# Patient Record
Sex: Male | Born: 1955 | Race: White | Hispanic: No | Marital: Married | Smoking: Current some day smoker
Health system: Southern US, Community
[De-identification: ages and names within clinical notes are randomized; demographics above are authoritative.]

## PROBLEM LIST (undated history)

## (undated) DIAGNOSIS — G4730 Sleep apnea, unspecified: Secondary | ICD-10-CM

## (undated) DIAGNOSIS — K219 Gastro-esophageal reflux disease without esophagitis: Secondary | ICD-10-CM

## (undated) DIAGNOSIS — D649 Anemia, unspecified: Secondary | ICD-10-CM

## (undated) DIAGNOSIS — D563 Thalassemia minor: Secondary | ICD-10-CM

## (undated) DIAGNOSIS — S92309A Fracture of unspecified metatarsal bone(s), unspecified foot, initial encounter for closed fracture: Secondary | ICD-10-CM

## (undated) DIAGNOSIS — I10 Essential (primary) hypertension: Secondary | ICD-10-CM

## (undated) DIAGNOSIS — F329 Major depressive disorder, single episode, unspecified: Secondary | ICD-10-CM

## (undated) DIAGNOSIS — F32A Depression, unspecified: Secondary | ICD-10-CM

## (undated) DIAGNOSIS — E785 Hyperlipidemia, unspecified: Secondary | ICD-10-CM

## (undated) DIAGNOSIS — N400 Enlarged prostate without lower urinary tract symptoms: Secondary | ICD-10-CM

## (undated) DIAGNOSIS — F419 Anxiety disorder, unspecified: Secondary | ICD-10-CM

## (undated) HISTORY — PX: FOOT ARTHROTOMY: SHX952

## (undated) HISTORY — DX: Essential (primary) hypertension: I10

## (undated) HISTORY — PX: OTHER SURGICAL HISTORY: SHX169

## (undated) HISTORY — PX: UPPER GI ENDOSCOPY: SHX6162

## (undated) HISTORY — DX: Benign prostatic hyperplasia without lower urinary tract symptoms: N40.0

## (undated) HISTORY — DX: Gastro-esophageal reflux disease without esophagitis: K21.9

## (undated) HISTORY — DX: Fracture of unspecified metatarsal bone(s), unspecified foot, initial encounter for closed fracture: S92.309A

## (undated) HISTORY — DX: Hyperlipidemia, unspecified: E78.5

## (undated) HISTORY — PX: COLONOSCOPY: SHX174

---
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
## 2006-03-24 ENCOUNTER — Ambulatory Visit: Payer: Self-pay | Admitting: Gastroenterology

## 2006-04-07 ENCOUNTER — Ambulatory Visit: Payer: Self-pay | Admitting: Gastroenterology

## 2007-08-14 ENCOUNTER — Ambulatory Visit (HOSPITAL_COMMUNITY): Admission: RE | Admit: 2007-08-14 | Discharge: 2007-08-14 | Payer: Self-pay | Admitting: Orthopedic Surgery

## 2008-09-05 HISTORY — PX: FINGER EXPLORATION: SHX1635

## 2009-03-11 ENCOUNTER — Encounter (INDEPENDENT_AMBULATORY_CARE_PROVIDER_SITE_OTHER): Payer: Self-pay | Admitting: Orthopedic Surgery

## 2009-03-11 ENCOUNTER — Ambulatory Visit (HOSPITAL_BASED_OUTPATIENT_CLINIC_OR_DEPARTMENT_OTHER): Admission: RE | Admit: 2009-03-11 | Discharge: 2009-03-11 | Payer: Self-pay | Admitting: Orthopedic Surgery

## 2010-09-05 DIAGNOSIS — G4730 Sleep apnea, unspecified: Secondary | ICD-10-CM

## 2010-09-05 HISTORY — DX: Sleep apnea, unspecified: G47.30

## 2010-12-12 LAB — POCT HEMOGLOBIN-HEMACUE: Hemoglobin: 12.6 g/dL — ABNORMAL LOW (ref 13.0–17.0)

## 2011-01-18 NOTE — Op Note (Signed)
NAME:  [name], [name] NO.:  [id number]   MEDICAL RECORD NO.:  [id number]          PATIENT TYPE:  AMB   LOCATION:  DSC                          FACILITY:  [facility]   PHYSICIAN:  Cindee Salt, M.D.       DATE OF BIRTH:  08/19/1956   DATE OF PROCEDURE:  03/11/2009  DATE OF DISCHARGE:                               OPERATIVE REPORT   PREOPERATIVE DIAGNOSIS:  Mass, left ring finger.   POSTOPERATIVE DIAGNOSIS:  Mass, left ring finger.   OPERATION:  Excisional biopsy of mass, left ring finger.   SURGEON:  Cindee Salt, MD   ASSISTANT:  Carolyne Fiscal, RN   ANESTHESIA:  Forearm-based IV regional.   ANESTHESIOLOGIST:  Bedelia Person, MD   HISTORY:  The patient is a 55 year old male with a history of mass of  the metacarpophalangeal joint volar aspect of his left ring finger.  He  has elected to have this removed.  He is aware of risks and  complications including infection, recurrence of injury to arteries,  nerves, tendons, incomplete relief of symptoms, dystrophy.  In the  preoperative area, the patient is seen, the extremity marked by both the  patient and surgeon, antibiotic given.   PROCEDURE:  The patient was brought to the operating room where a  forearm-based IV regional anesthetic was carried out without difficulty.  Antibiotic had been given in the preoperative area.  He was prepped  using ChloraPrep, supine position, left arm free.  A time-out was taken  along with a 3-minute dry time.  He was draped.  The patient's  procedure were confirmed.  An oblique incision was made after adequate  anesthesia was afforded with the IV regional anesthetic.  The incision  was directly over the mass, carried down through subcutaneous tissue.  The mass was immediately encountered.  This was a whitish multilobulated  mass and appeared solid.  With blunt and sharp dissection, it was  dissected free, found to arise from the flexor tendon.  The  neurovascular bundles on radial and ulnar side  were identified and  protected throughout the procedure.  The mass was excised.  A smaller  similar mass was then found, this was excised and sent to Pathology  along with the first.  Wound was copiously irrigated with saline.  The  skin was then closed with interrupted 4-0 Vicryl Rapide sutures.  Local  infiltration was given with  0.25% Marcaine without epinephrine.  A sterile compressive dressing was  applied.  On deflation of the tourniquet, all fingers immediately  pinked.  He was taken to the recovery room for observation in  satisfactory condition.  He will be discharged home to return to the  [facility] in 1 week on Vicodin.           ______________________________  Cindee Salt, M.D.     GK/MEDQ  D:  03/11/2009  T:  03/12/2009  Job:  [id number]   cc:   Salvatore Decent

## 2011-04-20 ENCOUNTER — Encounter: Payer: Self-pay | Admitting: Gastroenterology

## 2011-05-17 ENCOUNTER — Encounter: Payer: Self-pay | Admitting: Gastroenterology

## 2011-06-21 ENCOUNTER — Encounter: Payer: Self-pay | Admitting: Gastroenterology

## 2011-06-21 ENCOUNTER — Ambulatory Visit (INDEPENDENT_AMBULATORY_CARE_PROVIDER_SITE_OTHER): Payer: BC Managed Care – PPO | Admitting: Gastroenterology

## 2011-06-21 VITALS — BP 124/72 | HR 88 | Ht 70.0 in | Wt 165.0 lb

## 2011-06-21 DIAGNOSIS — K219 Gastro-esophageal reflux disease without esophagitis: Secondary | ICD-10-CM

## 2011-06-21 DIAGNOSIS — Z800 Family history of malignant neoplasm of digestive organs: Secondary | ICD-10-CM

## 2011-06-21 MED ORDER — PEG-KCL-NACL-NASULF-NA ASC-C 100 G PO SOLR: 1.0000 | Freq: Once | ORAL | Status: DC

## 2011-06-21 NOTE — Patient Instructions (Signed)
Take your omeprazole one tablet by mouth twice daily.  You have been scheduled for a Upper Endoscopy/ Colonoscopy. See separate instructions.  Pick up your prep kit from your pharmacy.  Patient advised to avoid spicy, acidic, citrus, chocolate, mints, fruit and fruit juices.  Limit the intake of caffeine, alcohol and Soda.  Don't exercise too soon after eating.  Don't lie down within 3-4 hours of eating.  Elevate the head of your bed. cc: Salvatore Decent, MD

## 2011-06-21 NOTE — Progress Notes (Signed)
History of Present Illness: This is a 55 year old male with frequent reflux symptoms over the past 3 years. He was initially treated with Prilosec OTC and when his symptoms worsened he was placed on omeprazole 40 mg daily and then recommended increase to twice daily which he has not done. He's been evaluated by an ENT physician and told of GERD and LPR, advised to modify caffeine intake and take omeprazole twice daily. He previously had postprandial and nocturnal heartburn but over the past year his symptoms have mainly been throat clearing. Denies weight loss, abdominal pain, constipation, diarrhea, change in stool caliber, melena, hematochezia, nausea, vomiting, dysphagia, chest pain.  Review of Systems: Pertinent positive and negative review of systems were noted in the above HPI section. All other review of systems were otherwise negative.  Current Medications, Allergies, Past Medical History, Past Surgical History, Family History and Social History were reviewed in [employer] record.  Physical Exam: General: Well developed , well nourished, no acute distress Head: Normocephalic and atraumatic Eyes:  sclerae anicteric, EOMI Ears: Normal auditory acuity Mouth: No deformity or lesions Neck: Supple, no masses or thyromegaly Lungs: Clear throughout to auscultation Heart: Regular rate and rhythm; no murmurs, rubs or bruits Abdomen: Soft, non tender and non distended. No masses, hepatosplenomegaly or hernias noted. Normal Bowel sounds Rectal: Deferred to colonoscopy  Musculoskeletal: Symmetrical with no gross deformities  Skin: No lesions on visible extremities Pulses:  Normal pulses noted Extremities: No clubbing, cyanosis, edema or deformities noted Neurological: Alert oriented x 4, grossly nonfocal Cervical Nodes:  No significant cervical adenopathy Inguinal Nodes: No significant inguinal adenopathy Psychological:  Alert and cooperative. Normal mood and  affect  Assessment and Recommendations:  1. 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 Standard antireflux measures and elevate the head of his bed. Omeprazole 40 mg twice daily is recommended. Schedule endoscopy to evaluate for Barrett's and erosive esophagitis. The risks, benefits, and alternatives to endoscopy with possible biopsy and possible dilation were discussed with the patient and they consent to proceed.   2. Family history of colon cancer in his mother. He is due for 5 years screening colonoscopy. The risks, benefits, and alternatives to colonoscopy with possible biopsy and possible polypectomy were discussed with the patient and they consent to proceed.

## 2011-06-22 ENCOUNTER — Encounter: Payer: Self-pay | Admitting: Gastroenterology

## 2011-06-27 ENCOUNTER — Encounter: Payer: Self-pay | Admitting: Gastroenterology

## 2011-06-27 ENCOUNTER — Ambulatory Visit (AMBULATORY_SURGERY_CENTER): Payer: BC Managed Care – PPO | Admitting: Gastroenterology

## 2011-06-27 DIAGNOSIS — K2990 Gastroduodenitis, unspecified, without bleeding: Secondary | ICD-10-CM

## 2011-06-27 DIAGNOSIS — Z1211 Encounter for screening for malignant neoplasm of colon: Secondary | ICD-10-CM

## 2011-06-27 DIAGNOSIS — D126 Benign neoplasm of colon, unspecified: Secondary | ICD-10-CM

## 2011-06-27 DIAGNOSIS — K2970 Gastritis, unspecified, without bleeding: Secondary | ICD-10-CM

## 2011-06-27 DIAGNOSIS — Z800 Family history of malignant neoplasm of digestive organs: Secondary | ICD-10-CM

## 2011-06-27 DIAGNOSIS — K219 Gastro-esophageal reflux disease without esophagitis: Secondary | ICD-10-CM

## 2011-06-27 DIAGNOSIS — K2940 Chronic atrophic gastritis without bleeding: Secondary | ICD-10-CM

## 2011-06-27 MED ORDER — SODIUM CHLORIDE 0.9 % IV SOLN: 500.0000 mL | INTRAVENOUS | Status: DC

## 2011-06-27 NOTE — Patient Instructions (Signed)
Green and blue discharge instructions reviewed with patient and care partner.  Impressions/recommendations:  Mild gastritis (handout given)  Polyp (handout given)  Repeat colonoscopy in 5 years.  Resume medications as you were taking them prior to your procedure.

## 2011-06-28 ENCOUNTER — Telehealth: Payer: Self-pay | Admitting: *Deleted

## 2011-06-28 NOTE — Telephone Encounter (Signed)

## 2011-06-30 ENCOUNTER — Encounter: Payer: Self-pay | Admitting: Gastroenterology

## 2012-07-12 ENCOUNTER — Other Ambulatory Visit: Payer: Self-pay | Admitting: Orthopedic Surgery

## 2012-07-13 ENCOUNTER — Encounter (HOSPITAL_BASED_OUTPATIENT_CLINIC_OR_DEPARTMENT_OTHER): Payer: Self-pay | Admitting: *Deleted

## 2012-07-13 NOTE — Progress Notes (Signed)
Pt has been her for hand surg 2010-now uses cpap-will bring cpap-overnight bag and meds in case her needs to stay-no cardiac or resp problems

## 2012-07-16 NOTE — H&P (Signed)
  Benjamin Lloyd is an 57 y.o. male.   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  He is 5'10" tall and weighs 160 pounds.  He has been having increasing right shoulder pain for the past six months.  He has stiffness.  He cannot place his arm in a sleeve comfortably.  He has trouble sleeping on the right and usually sleeps on his left side in a lateral decubitus position.  He has no numbness or tingling and no neck pain.  He has no history of diabetes or family history of diabetes.  He has not used any prescription pain medication for this predicament.      Past Medical History  Diagnosis Date  . GERD (gastroesophageal reflux disease)   . Depression   . Sleep apnea 2012    mild-mod-does use a cpap-will bring dos  . 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 Appendectomy 1970  . Colonoscopy   . Upper gi endoscopy   . Finger exploration 2010    mass lt finger  . Foot arthrotomy     left    Family History  Problem Relation Age of Onset  . Colon cancer Mother 83  . Heart disease Father    Social History:  reports that he quit smoking about 31 years ago. He has never used smokeless tobacco. He reports that he drinks alcohol. He reports that he does not use illicit drugs.  Allergies: No Known Allergies  No prescriptions prior to admission    No results found for this or any previous visit (from the past 48 hour(s)).  No results found.   Pertinent items are noted in HPI.  Height 5\' 10"  (1.778 m), weight 74.844 kg (165 lb).  General appearance: alert Head: Normocephalic, without obvious abnormality Neck: supple, symmetrical, trachea midline Resp: clear to auscultation bilaterally Cardio: regular rate and rhythm GI: normal findings: bowel sounds normal Extremities:  His shoulder range of motion reveals combined elevation 155  right, 180 left, external rotation 80 right and 95 left, internal rotation L-3 right and T-8 left.  He lacks 20 degrees of extension to the scapular plane right vs. hyperextension 10 degrees left.   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  His strength is well preserved.    RADIOGRAPHS:  Plain films of the shoulder demonstrate normal bony anatomy.  There is no calcification of the rotator cuff.     Pulses: 2+ and symmetric Skin: normal Neurologic: Grossly normal   Assessment/Plan Impression:Right shoulder impingement with A/C arthrosis   Plan: To the OR for right SA with SAD/DCR and debridement as needed.The procedure, risks,benefits and post-op course were discussed with the patient at length and they were in agreement with the plan.   Benjamin Lloyd J 07/16/2012, 4:16 PM

## 2012-07-17 ENCOUNTER — Encounter (HOSPITAL_BASED_OUTPATIENT_CLINIC_OR_DEPARTMENT_OTHER): Payer: Self-pay

## 2012-07-17 ENCOUNTER — Encounter (HOSPITAL_BASED_OUTPATIENT_CLINIC_OR_DEPARTMENT_OTHER)
Admission: RE | Disposition: A | Discharge status: Home / Self Care | Payer: Self-pay | Source: Ambulatory Visit | Attending: Orthopedic Surgery

## 2012-07-17 ENCOUNTER — Ambulatory Visit (HOSPITAL_BASED_OUTPATIENT_CLINIC_OR_DEPARTMENT_OTHER)
Admission: RE | Admit: 2012-07-17 | Discharge: 2012-07-18 | Disposition: A | Discharge status: Home / Self Care | Payer: BC Managed Care – PPO | Source: Ambulatory Visit | Attending: Orthopedic Surgery | Admitting: Orthopedic Surgery

## 2012-07-17 DIAGNOSIS — K219 Gastro-esophageal reflux disease without esophagitis: Secondary | ICD-10-CM | POA: Insufficient documentation

## 2012-07-17 DIAGNOSIS — M19019 Primary osteoarthritis, unspecified shoulder: Secondary | ICD-10-CM | POA: Insufficient documentation

## 2012-07-17 DIAGNOSIS — M7500 Adhesive capsulitis of unspecified shoulder: Secondary | ICD-10-CM | POA: Insufficient documentation

## 2012-07-17 DIAGNOSIS — M24119 Other articular cartilage disorders, unspecified shoulder: Secondary | ICD-10-CM | POA: Insufficient documentation

## 2012-07-17 HISTORY — DX: Anemia, unspecified: D64.9

## 2012-07-17 HISTORY — DX: Sleep apnea, unspecified: G47.30

## 2012-07-17 HISTORY — PX: SHOULDER ARTHROSCOPY WITH SUBACROMIAL DECOMPRESSION: SHX5684

## 2012-07-17 HISTORY — PX: SHOULDER ARTHROSCOPY WITH DISTAL CLAVICLE RESECTION: SHX5675

## 2012-07-17 HISTORY — DX: Depression, unspecified: F32.A

## 2012-07-17 HISTORY — DX: Major depressive disorder, single episode, unspecified: F32.9

## 2012-07-17 SURGERY — SHOULDER ARTHROSCOPY WITH DISTAL CLAVICLE RESECTION
Anesthesia: General | Site: Shoulder | Laterality: Right | Wound class: Clean

## 2012-07-17 SURGICAL SUPPLY — 80 items
ANCHOR PUSHLOCK PEEK 3.5X19.5 (Anchor) ×2 IMPLANT
BANDAGE ADHESIVE 1X3 (GAUZE/BANDAGES/DRESSINGS) IMPLANT
BLADE AVERAGE 25X9 (BLADE) IMPLANT
BLADE CUTTER MENIS 5.5 (BLADE) ×2 IMPLANT
BLADE SURG 15 STRL LF DISP TIS (BLADE) ×2 IMPLANT
BLADE SURG 15 STRL SS (BLADE) ×2
BUR EGG 3PK/BX (BURR) IMPLANT
BUR OVAL 6.0 (BURR) ×2 IMPLANT
CANISTER OMNI JUG 16 LITER (MISCELLANEOUS) ×4 IMPLANT
CANISTER SUCTION 2500CC (MISCELLANEOUS) IMPLANT
CANNULA SHOULDER 7CM (CANNULA) IMPLANT
CANNULA TWIST IN 8.25X7CM (CANNULA) ×2 IMPLANT
CLEANER CAUTERY TIP 5X5 PAD (MISCELLANEOUS) IMPLANT
CLOTH BEACON ORANGE TIMEOUT ST (SAFETY) ×2 IMPLANT
CUTTER MENISCUS  4.2MM (BLADE) ×1
CUTTER MENISCUS 4.2MM (BLADE) ×1 IMPLANT
DECANTER SPIKE VIAL GLASS SM (MISCELLANEOUS) IMPLANT
DRAPE INCISE IOBAN 66X45 STRL (DRAPES) ×2 IMPLANT
DRAPE STERI 35X30 U-POUCH (DRAPES) ×2 IMPLANT
DRAPE SURG 17X23 STRL (DRAPES) ×2 IMPLANT
DRAPE U-SHAPE 47X51 STRL (DRAPES) ×2 IMPLANT
DRAPE U-SHAPE 76X120 STRL (DRAPES) ×4 IMPLANT
DRSG PAD ABDOMINAL 8X10 ST (GAUZE/BANDAGES/DRESSINGS) ×2 IMPLANT
DURAPREP 26ML APPLICATOR (WOUND CARE) ×2 IMPLANT
ELECT REM PT RETURN 9FT ADLT (ELECTROSURGICAL) ×2
ELECTRODE REM PT RTRN 9FT ADLT (ELECTROSURGICAL) ×1 IMPLANT
GLOVE BIO SURGEON STRL SZ 6.5 (GLOVE) ×2 IMPLANT
GLOVE BIOGEL M STRL SZ7.5 (GLOVE) ×2 IMPLANT
GLOVE BIOGEL PI IND STRL 7.0 (GLOVE) ×1 IMPLANT
GLOVE BIOGEL PI IND STRL 8 (GLOVE) ×2 IMPLANT
GLOVE BIOGEL PI INDICATOR 7.0 (GLOVE) ×1
GLOVE BIOGEL PI INDICATOR 8 (GLOVE) ×2
GLOVE ORTHO TXT STRL SZ7.5 (GLOVE) ×2 IMPLANT
GOWN BRE IMP PREV XXLGXLNG (GOWN DISPOSABLE) ×4 IMPLANT
GOWN PREVENTION PLUS XLARGE (GOWN DISPOSABLE) ×2 IMPLANT
LASSO SUT 90 DEGREE (SUTURE) ×2 IMPLANT
LOOP 2 FIBERLINK CLOSED (SUTURE) ×2 IMPLANT
NDL SUT 6 .5 CRC .975X.05 MAYO (NEEDLE) IMPLANT
NEEDLE MAYO TAPER (NEEDLE)
NEEDLE MINI RC 24MM (NEEDLE) IMPLANT
NEEDLE SCORPION (NEEDLE) ×2 IMPLANT
PACK ARTHROSCOPY DSU (CUSTOM PROCEDURE TRAY) ×2 IMPLANT
PACK BASIN DAY SURGERY FS (CUSTOM PROCEDURE TRAY) ×2 IMPLANT
PAD CLEANER CAUTERY TIP 5X5 (MISCELLANEOUS)
PASSER SUT SWANSON 36MM LOOP (INSTRUMENTS) IMPLANT
PENCIL BUTTON HOLSTER BLD 10FT (ELECTRODE) IMPLANT
SLEEVE SCD COMPRESS KNEE MED (MISCELLANEOUS) ×2 IMPLANT
SLING ARM FOAM STRAP LRG (SOFTGOODS) ×2 IMPLANT
SLING ARM FOAM STRAP MED (SOFTGOODS) IMPLANT
SPONGE GAUZE 4X4 12PLY (GAUZE/BANDAGES/DRESSINGS) ×2 IMPLANT
SPONGE LAP 4X18 X RAY DECT (DISPOSABLE) ×2 IMPLANT
STRIP CLOSURE SKIN 1/2X4 (GAUZE/BANDAGES/DRESSINGS) ×2 IMPLANT
SUCTION FRAZIER TIP 10 FR DISP (SUCTIONS) IMPLANT
SUT ETHIBOND 2 OS 4 DA (SUTURE) IMPLANT
SUT ETHILON 4 0 PS 2 18 (SUTURE) IMPLANT
SUT FIBERWIRE #2 38 T-5 BLUE (SUTURE)
SUT FIBERWIRE 3-0 18 TAPR NDL (SUTURE)
SUT PROLENE 1 CT (SUTURE) IMPLANT
SUT PROLENE 3 0 PS 2 (SUTURE) ×2 IMPLANT
SUT TIGER TAPE 7 IN WHITE (SUTURE) IMPLANT
SUT VIC AB 0 CT1 27 (SUTURE)
SUT VIC AB 0 CT1 27XBRD ANBCTR (SUTURE) IMPLANT
SUT VIC AB 0 SH 27 (SUTURE) IMPLANT
SUT VIC AB 2-0 SH 27 (SUTURE)
SUT VIC AB 2-0 SH 27XBRD (SUTURE) IMPLANT
SUT VIC AB 3-0 SH 27 (SUTURE) ×1
SUT VIC AB 3-0 SH 27X BRD (SUTURE) ×1 IMPLANT
SUT VIC AB 3-0 X1 27 (SUTURE) IMPLANT
SUTURE FIBERWR #2 38 T-5 BLUE (SUTURE) IMPLANT
SUTURE FIBERWR 3-0 18 TAPR NDL (SUTURE) IMPLANT
SYR 3ML 23GX1 SAFETY (SYRINGE) IMPLANT
SYR BULB 3OZ (MISCELLANEOUS) IMPLANT
TAPE FIBER 2MM 7IN #2 BLUE (SUTURE) IMPLANT
TAPE PAPER 3X10 WHT MICROPORE (GAUZE/BANDAGES/DRESSINGS) ×2 IMPLANT
TOWEL OR 17X24 6PK STRL BLUE (TOWEL DISPOSABLE) ×2 IMPLANT
TUBE CONNECTING 20X1/4 (TUBING) ×2 IMPLANT
TUBING ARTHROSCOPY IRRIG 16FT (MISCELLANEOUS) ×2 IMPLANT
WAND STAR VAC 90 (SURGICAL WAND) ×2 IMPLANT
WATER STERILE IRR 1000ML POUR (IV SOLUTION) ×2 IMPLANT
YANKAUER SUCT BULB TIP NO VENT (SUCTIONS) IMPLANT

## 2012-07-18 MED ORDER — LIDOCAINE HCL 2 % IJ SOLN
INTRAMUSCULAR | Status: DC | PRN
  Administered 2012-07-17: 3 mL

## 2012-07-18 MED ORDER — SODIUM CHLORIDE 0.9 % IR SOLN
Status: DC | PRN
  Administered 2012-07-17: 18000 mL

## 2012-07-18 MED ORDER — METHYLPREDNISOLONE ACETATE 40 MG/ML IJ SUSP
INTRAMUSCULAR | Status: DC | PRN
  Administered 2012-07-17: 80 mg via INTRA_ARTICULAR

## 2012-07-19 ENCOUNTER — Encounter (HOSPITAL_BASED_OUTPATIENT_CLINIC_OR_DEPARTMENT_OTHER): Payer: Self-pay | Admitting: Orthopedic Surgery

## 2012-07-19 NOTE — H&P (Signed)
History and physical reviewed and signed.  EPIC unavailable due to fiberoptic cable damage.

## 2012-07-20 NOTE — Op Note (Signed)
NAME:  Benjamin Lloyd, Benjamin Lloyd NO.:  [id number]  MEDICAL RECORD NO.:  [id number]  LOCATION:                                 FACILITY:  PHYSICIAN:  Katy Fitch. Vermelle Cammarata, M.D. 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  POSTOPERATIVE DIAGNOSES:  Chronis superior labrum anterior and posterior tear, anterior superior glenohumeral ligament instability.  Acromioclavicular degenerative arthritis.  OPERATION: 1. Diagnostic arthroscopy, right shoulder. 2. Arthroscopic debridement of anterior and superior glenohumeral     ligament origin, unstable anterior labrum and biceps origin. 3. Arthroscopic reconstruction of biceps origin and anterior superior     glenohumeral ligament origin to decorticated superior glenoid. 4. Arthroscopic subacromial decompression with bursectomy,     coracoacromial ligament relaxation and acromioplasty. 5. Arthroscopic distal clavicle excision.  OPERATING SURGEON:  Katy Fitch. Martasia Talamante, MD  ASSISTANT:  Marveen Reeks. Dasnoit, PA-C.  ANESTHESIA:  General by endotracheal technique with right plexus block placed by Dr. Chaney Malling preoperatively.  SUPERVISING ANESTHESIOLOGIST:  Achille Rich, MD  INDICATIONS:  Benjamin Lloyd is a 56 year old gentleman from [place] with history of a painful and stiff right shoulder.  Clinical examination initially revealed signs of adhesive capsulitis.  He responded to injection and therapy.  He had persistent pain and had plain films documenting unfavorable AC anatomy.  Due to persistent pain and signs of chronic stage two impingement by MRI.  His MRI revealed a very significant AC arthritis with closure of the subacromial outlet and abrasion tendinopathy with bursitis.  He appeared to have some irregularities of the labrum.  Due to increasing  pain, we recommended proceeding with diagnostic arthroplasty, anticipating debridement of his capsulitis, subacromial decompresion and distal clavicle excision.  After informed consent, he was brought to the operating room at this time.  PROCEDURE:  Benjamin Lloyd was brought to room 6 of [facility] and placed in supine position on the operating table.  Following anesthesia and informed consent by Dr. Chaney Malling, a right brachial plexus block was placed with ultrasound guidance without complications. Excellent anesthesia of the right arm and forequarter was achieved.  In room 6 under Dr. Seward Meth direct supervision, general endotracheal anesthesia was induced followed by careful position of the beach chair position, in a torso and head holder designed for shoulder arthroscopy.  The entire right upper extremity and forequarter were prepped with DuraPrep and draped with impervious arthroscopy drapes.  Examination of the shoulder under anesthesia revealed elevation to 165, external rotation 80, internal rotation 70, he lacked extension to the  interscapular plane due to chronic scarring from adhesive capsulitis.  Following routine surgical time-out, the scope was introduced into the   standard posterior viewing portal with blunt technique.  Diagnostic arthroscopy confirmed intact hyaline cartilage surfaces of the glenoid and humeral head.  There was a superior labral tear from deep to the biceps origin to 4 o'clock anteriorly that was grossly unstable.  The anterior superior glenohumeral ligament was unstable.  The biceps had a type 2 SLAP lesion with instability proximally and posterior labrum was intact. The  insertion of the rotator cuff was otherwise unremarkable. The biceps tendon was normal to the rotator. interval.  The subscapularis insertion was intact.  We elected to proceed with a biceps origin repair and anterior superior glenohumeral ligament reconstruction to the  anterior glenoid.  With the aid of a clear cannula placed in the anterior superior portal, we placed Fiberlink stitch through the biceps and into  the anterior superior glenohumeral ligament followed by use of a 3.5 mm push lock placed through a drill hole in the glenoid at approximately 1 o'clock. With standard technique.  A very stable biceps origin was achieved documented with the digital camera.  We then performed residual debridement of the adhesive capsulitis granulation tissue and adhesions to the capsular ligaments followed by hemostasis with bipolar cautery.  Photographic documentation of our final construct was accomplished with the digital camera followed by removal of the scope from the joint. The scope was the placed in the subacromial space  The large acromial and clavicle osteophytes were resected with the suction bur to a type 1 morphology followed by hemostasis.  Photographic documentation before and after was Accomplished with the digital camera.   We then placed the scope in the joint and due to the history of adhesive capsulitis, used a spinal needle to instill approximately 40 mg of Depo-Medrol and 3 cc of 2% lidocaine.  The scope was removed.  The portals were then repaired with 3-0 Prolene. Benjamin Lloyd was awakened from general anesthesia, transferred to the recovery room with stable vital signs.  The shoulder was dressed with sterile guaze and paper tape. He will be [facility] in [facility] for 20 hours with attention to pain relief and will be provided three doses of Ancef 1 gram q 6 hours as a prophylactic antibiotic.     Katy Fitch Benjamin Lloyd, M.D.     RVS/MEDQ  D:  07/17/2012  T:  07/18/2012  Job:  [id number]

## 2015-09-06 HISTORY — PX: COLONOSCOPY: SHX174

## 2016-05-11 ENCOUNTER — Encounter (HOSPITAL_COMMUNITY): Payer: Self-pay | Admitting: Emergency Medicine

## 2016-05-11 ENCOUNTER — Emergency Department (HOSPITAL_COMMUNITY)
Admission: EM | Admit: 2016-05-11 | Discharge: 2016-05-11 | Disposition: A | Discharge status: Home / Self Care | Payer: BLUE CROSS/BLUE SHIELD | Attending: Emergency Medicine | Admitting: Emergency Medicine

## 2016-05-11 ENCOUNTER — Emergency Department (HOSPITAL_BASED_OUTPATIENT_CLINIC_OR_DEPARTMENT_OTHER)
Admit: 2016-05-11 | Discharge: 2016-05-11 | Disposition: A | Discharge status: Home / Self Care | Payer: BLUE CROSS/BLUE SHIELD

## 2016-05-11 DIAGNOSIS — M799 Soft tissue disorder, unspecified: Secondary | ICD-10-CM

## 2016-05-11 DIAGNOSIS — M7989 Other specified soft tissue disorders: Secondary | ICD-10-CM | POA: Diagnosis not present

## 2016-05-11 DIAGNOSIS — Z79899 Other long term (current) drug therapy: Secondary | ICD-10-CM | POA: Diagnosis not present

## 2016-05-11 DIAGNOSIS — L089 Local infection of the skin and subcutaneous tissue, unspecified: Secondary | ICD-10-CM | POA: Diagnosis not present

## 2016-05-11 DIAGNOSIS — Z87891 Personal history of nicotine dependence: Secondary | ICD-10-CM | POA: Diagnosis not present

## 2016-05-11 DIAGNOSIS — R2242 Localized swelling, mass and lump, left lower limb: Secondary | ICD-10-CM | POA: Diagnosis present

## 2016-05-11 MED ORDER — CEPHALEXIN 500 MG PO CAPS: 500.0000 mg | ORAL_CAPSULE | Freq: Four times a day (QID) | ORAL | 0 refills | Status: DC

## 2016-05-11 MED ORDER — IBUPROFEN 800 MG PO TABS: 800.0000 mg | ORAL_TABLET | Freq: Three times a day (TID) | ORAL | 0 refills | Status: AC

## 2016-05-11 NOTE — ED Notes (Signed)
Spoke w/Jill,[facility]

## 2016-05-11 NOTE — ED Provider Notes (Signed)
[place] DEPT Provider Note   CSN: [id number] Arrival date & time: 05/11/16  [place]     History   Chief Complaint Chief Complaint  Patient presents with  . Leg Swelling    HPI Benjamin Lloyd is a 60 y.o. male.  HPI   Patient is a 60 year old male presents to emergency room with complaint of left calf swelling, redness and soreness that has been slowly and gradually worsening over the past 3 weeks. He first noticed a sore area August 21, felt like a bruise but he did not recall any injury, he also did not have any cuts or bug bites that he recalls.  The past 3 weeks it is enlarged, become red and firm, is tender to the touch, and causing him to limp.  He describes his pain as mild, 3 out of 10, he has not tried any over-the-counter medications for his discomfort. He went to his PCPs office today, he states that they did not want to treat for infection until they ruled out DVT that he was referred to the emergency department for vascular study.  The area of pain is circular 3 cm in diameter area just medial mid left calf. He has not have any other leg, foot or knee tenderness or swelling.  He denies claudication.  He denies any fever, sweats, chills, chest pain, shortness of breath.  Past Medical History:  Diagnosis Date  . 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 Depression   . GERD (gastroesophageal reflux disease)   . Sleep apnea 2012   mild-mod-does use a cpap-will bring dos    Patient Active Problem List   Diagnosis Date Noted  . Esophageal reflux 06/21/2011    Past Surgical History:  Procedure Laterality Date  . APPENDECTOMY  1970  . COLONOSCOPY    . FINGER EXPLORATION  2010   mass lt finger  . FOOT ARTHROTOMY     left  . SHOULDER ARTHROSCOPY WITH DISTAL CLAVICLE RESECTION  07/17/2012   Procedure: SHOULDER ARTHROSCOPY WITH DISTAL CLAVICLE RESECTION;  Surgeon: Cammie Sickle., MD;  Location: [facility];  Service: Orthopedics;  Laterality:  Right;  Right Shoulder Arthroscopy; Superior Labrum Anterior-Posterior Debridement / Repair; Subacromial Decompression and Distal Clavicle Resection  . SHOULDER ARTHROSCOPY WITH SUBACROMIAL DECOMPRESSION  07/17/2012   Procedure: SHOULDER ARTHROSCOPY WITH SUBACROMIAL DECOMPRESSION;  Surgeon: Cammie Sickle., MD;  Location: [place];  Service: Orthopedics;  Laterality: Right;  Right Shoulder Arthroscopy; Superior Labrum Anterior-Posterior Debridement / Repair; Subacromial Decompression and Distal Clavicle Resection  . UPPER GI ENDOSCOPY         Home Medications    Prior to Admission medications   Medication Sig Start Date End Date Taking? Authorizing Provider  escitalopram (LEXAPRO) 10 MG tablet Take 10 mg by mouth daily.   Yes Historical Provider, MD  fenofibrate (TRICOR) 145 MG tablet Take 145 mg by mouth daily.   Yes Historical Provider, MD  omega-3 acid ethyl esters (LOVAZA) 1 G capsule Take 1 g by mouth 4 (four) times daily.    Yes Historical Provider, MD  omeprazole (PRILOSEC) 40 MG capsule Take 40 mg by mouth daily.     Yes Historical Provider, MD  cephALEXin (KEFLEX) 500 MG capsule Take 1 capsule (500 mg total) by mouth 4 (four) times daily. 05/12/16   Delsa Grana, PA-C  ibuprofen (ADVIL,MOTRIN) 800 MG tablet Take 1 tablet (800 mg total) by mouth 3 (three) times daily. 05/11/16   Kristeen Miss  Vielka Klinedinst, PA-C  PARoxetine (PAXIL) 30 MG tablet Take 30 mg by mouth every morning.    Historical Provider, MD    Family History Family History  Problem Relation Age of Onset  . Colon cancer Mother 48  . Heart disease Father     Social History Social History  Substance Use Topics  . Smoking status: Former Smoker    Quit date: 07/13/1981  . Smokeless tobacco: Never Used  . Alcohol use Yes     Comment: One drink daily      Allergies   Review of patient's allergies indicates no known allergies.   Review of Systems Review of Systems  All other systems reviewed and are  negative.    Physical Exam Updated Vital Signs BP 170/77 (BP Location: Right Arm)   Pulse 68   Temp 98.1 F (36.7 C) (Oral)   Resp 23   Ht 5\' 10"  (1.778 m)   Wt 80.9 kg   SpO2 98%   BMI 25.58 kg/m   Physical Exam  Constitutional: He is oriented to person, place, and time. He appears well-developed and well-nourished. No distress.  HENT:  Head: Normocephalic and atraumatic.  Right Ear: External ear normal.  Left Ear: External ear normal.  Nose: Nose normal.  Mouth/Throat: Oropharynx is clear and moist. No oropharyngeal exudate.  Eyes: Conjunctivae and EOM are normal. Pupils are equal, round, and reactive to light. Right eye exhibits no discharge. Left eye exhibits no discharge. No scleral icterus.  Neck: Normal range of motion. Neck supple. No JVD present. No tracheal deviation present.  Cardiovascular: Normal rate, regular rhythm, normal heart sounds and intact distal pulses.  Exam reveals no gallop and no friction rub.   No murmur heard. Pulmonary/Chest: Effort normal and breath sounds normal. No stridor. No respiratory distress. He has no wheezes. He has no rales. He exhibits no tenderness.  Musculoskeletal: Normal range of motion. He exhibits tenderness. He exhibits no edema.       Legs: Lymphadenopathy:    He has no cervical adenopathy.  Neurological: He is alert and oriented to person, place, and time. He exhibits normal muscle tone. Coordination normal.  Skin: Skin is warm and dry. No rash noted. He is not diaphoretic. There is erythema. No pallor.  Psychiatric: He has a normal mood and affect. His behavior is normal. Judgment and thought content normal.  Nursing note and vitals reviewed.    ED Treatments / Results  Labs (all labs ordered are listed, but only abnormal results are displayed) Labs Reviewed - No data to display  EKG  EKG Interpretation None       Radiology No results found.   Progress Notes Date of Service: 05/11/2016 7:16 PM Chapman Fitch    Vascular Lab    [] Hide copied text [] Hover for attribution information *PRELIMINARY RESULTS* Vascular Ultrasound Left lower extremity venous duplex has been completed.  Preliminary findings: no evidence of DVT or superficial thrombosis.  Landry Mellow, RDMS, RVT  05/11/2016, 7:16 PM        EMERGENCY DEPARTMENT US SOFT TISSUE INTERPRETATION "Study: Limited Ultrasound of the noted body part in comments below"  INDICATIONS: Pain and Other (refer to comments) Multiple views of the body part are obtained with a multi-frequency linear probe  PERFORMED BY:  Myself  IMAGES ARCHIVED?: Yes  SIDE:Left  BODY PART:Lower extremity  FINDINGS: No abcess noted and Cellulitis absent  LIMITATIONS:  Body Habitus and Emergent Procedure  INTERPRETATION:  No abcess noted, No cellulitis noted and No  abnormal findings noted  COMMENT:  Left medial mid calf, small area of swollen and minimally erythematous and tender skin gradually worsening over 3 weeks    Procedures Procedures (including critical care time)  Medications Ordered in ED Medications - No data to display   Initial Impression / Assessment and Plan / ED Course  I have reviewed the triage vital signs and the nursing notes.  Pertinent labs & imaging results that were available during my care of the patient were reviewed by me and considered in my medical decision making (see chart for details).  Clinical Course  Patient is a 60 year old male with 3 weeks of small area of left calf swelling and redness, gradually progressing, mild tenderness with palpation and with ambulation. He was seen by his regular doctor today, PCP sent him to the ER for DVT study.   Duplex was negative, bedside ultrasound was utilized to evaluate for cellulitis.  The small area of induration without fluctuance, ultrasound did not appear consistent with cellulitis, no abscess present. Area is directly over a superficial vein, may be some superficial phlebitis,  patient also states he felt that it may been bruised may also be contusion with soft tissue inflammation. It is not appear infected however his doctor wanted to treat him with antibiotics after ruling out DVT.  He is prescribed NSAIDs and Keflex, only to fill Keflex after speaking with his PCP tomorrow morning.    Patient is otherwise well appearing, without any complaints, in agreement with plan, will follow up with PCP tomorrow morning.  Final Clinical Impressions(s) / ED Diagnoses   Final diagnoses:  Inflammation of soft tissue    New Prescriptions New Prescriptions   CEPHALEXIN (KEFLEX) 500 MG CAPSULE    Take 1 capsule (500 mg total) by mouth 4 (four) times daily.   IBUPROFEN (ADVIL,MOTRIN) 800 MG TABLET    Take 1 tablet (800 mg total) by mouth 3 (three) times daily.     Delsa Grana, PA-C 05/11/16 2204    Daleen Bo, MD 05/11/16 2329

## 2016-05-11 NOTE — Progress Notes (Signed)
*  PRELIMINARY RESULTS* Vascular Ultrasound Left lower extremity venous duplex has been completed.  Preliminary findings: no evidence of DVT or superficial thrombosis.  Landry Mellow, RDMS, RVT  05/11/2016, 7:16 PM

## 2016-05-11 NOTE — ED Triage Notes (Signed)
Pt c/o swelling in L leg x2 weeks, leg is red and swollen.

## 2016-05-11 NOTE — Discharge Instructions (Signed)
You were seen for left calf swelling, redness and pain.  The DVT study was negative.  Your skin swelling and redness most resembles soft tissue inflammation, which can be from contusion, local allergic reaction or even phlebitis.  The bedside ultrasound was negative for a soft tissue infection, or cellulitis, also negative for abscess.  Clinically does not appear like an infection.  Please call your PCP to see if he would like to initiate a trial of antibiotics.  Take the ibuprofen, and apply heat, for conservative treatment.    Information was printed regarding contusion, cellulitis and phlebitis - you are not diagnosed with all of these, they are just FYI.

## 2016-05-24 ENCOUNTER — Telehealth: Payer: Self-pay | Admitting: Gastroenterology

## 2016-05-24 NOTE — Telephone Encounter (Signed)
Explained that he is due for colon recall only.  He is scheduled for 06/15/16 and pre-visit 06/06/16

## 2016-06-06 ENCOUNTER — Ambulatory Visit (AMBULATORY_SURGERY_CENTER): Payer: Self-pay

## 2016-06-06 VITALS — Ht 70.0 in | Wt 178.0 lb

## 2016-06-06 DIAGNOSIS — Z800 Family history of malignant neoplasm of digestive organs: Secondary | ICD-10-CM

## 2016-06-06 MED ORDER — SUPREP BOWEL PREP KIT 17.5-3.13-1.6 GM/177ML PO SOLN: 1.0000 | Freq: Once | ORAL | 0 refills | Status: AC

## 2016-06-06 NOTE — Progress Notes (Signed)
No allergies to eggs or soy No past problems with anesthesia No diet meds No home oxygen  Declined emmi 

## 2016-06-07 ENCOUNTER — Encounter: Payer: Self-pay | Admitting: Gastroenterology

## 2016-06-15 ENCOUNTER — Encounter: Payer: Self-pay | Admitting: Gastroenterology

## 2016-06-15 ENCOUNTER — Ambulatory Visit (AMBULATORY_SURGERY_CENTER): Payer: BLUE CROSS/BLUE SHIELD | Admitting: Gastroenterology

## 2016-06-15 VITALS — BP 123/60 | HR 67 | Temp 97.8°F | Resp 13 | Ht 70.0 in | Wt 178.0 lb

## 2016-06-15 DIAGNOSIS — Z1212 Encounter for screening for malignant neoplasm of rectum: Secondary | ICD-10-CM

## 2016-06-15 DIAGNOSIS — Z1211 Encounter for screening for malignant neoplasm of colon: Secondary | ICD-10-CM | POA: Diagnosis present

## 2016-06-15 DIAGNOSIS — Z800 Family history of malignant neoplasm of digestive organs: Secondary | ICD-10-CM | POA: Diagnosis not present

## 2016-06-15 DIAGNOSIS — D124 Benign neoplasm of descending colon: Secondary | ICD-10-CM

## 2016-06-15 MED ORDER — SODIUM CHLORIDE 0.9 % IV SOLN: 500.0000 mL | INTRAVENOUS | Status: DC

## 2016-06-15 NOTE — Progress Notes (Signed)
Called to room to assist during endoscopic procedure.  Patient ID and intended procedure confirmed with present staff. Received instructions for my participation in the procedure from the performing physician.  

## 2016-06-15 NOTE — Op Note (Signed)
[facility] Patient Name: Benjamin Lloyd Procedure Date: 06/15/2016 1:59 PM MRN: [id number] Endoscopist: Ladene Artist , MD Age: 60 Referring MD:  Date of Birth: [birth year] Gender: Male Account #: [id number] Procedure:                Colonoscopy Indications:              Screening in patient at increased risk: Family                            history of 1st-degree relative with colorectal                            cancer Medicines:                Monitored Anesthesia Care Procedure:                Pre-Anesthesia Assessment:                           - Prior to the procedure, a History and Physical                            was performed, and patient medications and                            allergies were reviewed. The patient's tolerance of                            previous anesthesia was also reviewed. The risks                            and benefits of the procedure and the sedation                            options and risks were discussed with the patient.                            All questions were answered, and informed consent                            was obtained. Prior Anticoagulants: The patient has                            taken no previous anticoagulant or antiplatelet                            agents. ASA Grade Assessment: II - A patient with                            mild systemic disease. After reviewing the risks                            and benefits, the patient was deemed in  satisfactory condition to undergo the procedure.                           After obtaining informed consent, the colonoscope                            was passed under direct vision. Throughout the                            procedure, the patient's blood pressure, pulse, and                            oxygen saturations were monitored continuously. The                            Model [id number]-[id number]) scope was introduced                       through the anus and advanced to the the cecum,                            identified by appendiceal orifice and ileocecal                            valve. The ileocecal valve, appendiceal orifice,                            and rectum were photographed. The quality of the                            bowel preparation was good. The colonoscopy was                            performed without difficulty. The patient tolerated                            the procedure well. Scope In: 2:20:56 PM Scope Out: 2:30:18 PM Scope Withdrawal Time: 0 hours 8 minutes 22 seconds  Total Procedure Duration: 0 hours 9 minutes 22 seconds  Findings:                 The perianal and digital rectal examinations were                            normal.                           A 6 mm polyp was found in the descending colon. The                            polyp was sessile. The polyp was removed with a                            cold snare. Resection and retrieval were complete.  The exam was otherwise without abnormality on                            direct and retroflexion views. Complications:            No immediate complications. Estimated blood loss:                            None. Estimated Blood Loss:     Estimated blood loss: none. Impression:               - One 6 mm polyp in the descending colon, removed                            with a cold snare. Resected and retrieved.                           - The examination was otherwise normal on direct                            and retroflexion views. Recommendation:           - Repeat colonoscopy in 5 years for surveillance.                           - Patient has a contact number available for                            emergencies. The signs and symptoms of potential                            delayed complications were discussed with the                            patient. Return to normal activities  tomorrow.                            Written discharge instructions were provided to the                            patient.                           - Resume previous diet.                           - Continue present medications.                           - Await pathology results. Ladene Artist, MD 06/15/2016 2:36:23 PM This report has been signed electronically.

## 2016-06-15 NOTE — Progress Notes (Signed)
  [facility] Post-op Note  Patient: Benjamin Lloyd  Procedure(s) Performed: colonoscopy  Patient Location: [facility] - Recovery Area  Anesthesia Type: Deep Sedation/Propofol  Level of Consciousness: awake, oriented and patient cooperative  Airway and Oxygen Therapy: Patient Spontanous Breathing  Post-op Pain: none  Post-op Assessment:  Post-op Vital signs reviewed, Patient's Cardiovascular Status Stable, Respiratory Function Stable, Patent Airway, No signs of Nausea or vomiting and Pain level controlled  Post-op Vital Signs: Reviewed and stable  Complications: No apparent anesthesia complications  Emilea Goga E 2:36 PM

## 2016-06-15 NOTE — Patient Instructions (Signed)
Colon polyp x 1 removed today. Handout given on polyps. Result letter in your mail in 2-3 weeks. Resume current medications. Call us with any questions or concerns. Thank you!   YOU HAD AN ENDOSCOPIC PROCEDURE TODAY AT [facility]:   Refer to the procedure report that was given to you for any specific questions about what was found during the examination.  If the procedure report does not answer your questions, please call your gastroenterologist to clarify.  If you requested that your care partner not be given the details of your procedure findings, then the procedure report has been included in a sealed envelope for you to review at your convenience later.  YOU SHOULD EXPECT: Some feelings of bloating in the abdomen. Passage of more gas than usual.  Walking can help get rid of the air that was put into your GI tract during the procedure and reduce the bloating. If you had a lower endoscopy (such as a colonoscopy or flexible sigmoidoscopy) you may notice spotting of blood in your stool or on the toilet paper. If you underwent a bowel prep for your procedure, you may not have a normal bowel movement for a few days.  Please Note:  You might notice some irritation and congestion in your nose or some drainage.  This is from the oxygen used during your procedure.  There is no need for concern and it should clear up in a day or so.  SYMPTOMS TO REPORT IMMEDIATELY:   Following lower endoscopy (colonoscopy or flexible sigmoidoscopy):  Excessive amounts of blood in the stool  Significant tenderness or worsening of abdominal pains  Swelling of the abdomen that is new, acute  Fever of 100F or higher   For urgent or emergent issues, a gastroenterologist can be reached at any hour by calling [phone].   DIET:  We do recommend a small meal at first, but then you may proceed to your regular diet.  Drink plenty of fluids but you should avoid alcoholic beverages for 24  hours.  ACTIVITY:  You should plan to take it easy for the rest of today and you should NOT DRIVE or use heavy machinery until tomorrow (because of the sedation medicines used during the test).    FOLLOW UP: Our staff will call the number listed on your records the next business day following your procedure to check on you and address any questions or concerns that you may have regarding the information given to you following your procedure. If we do not reach you, we will leave a message.  However, if you are feeling well and you are not experiencing any problems, there is no need to return our call.  We will assume that you have returned to your regular daily activities without incident.  If any biopsies were taken you will be contacted by phone or by letter within the next 1-3 weeks.  Please call us at [phone] if you have not heard about the biopsies in 3 weeks.    SIGNATURES/CONFIDENTIALITY: You and/or your care partner have signed paperwork which will be entered into your electronic medical record.  These signatures attest to the fact that that the information above on your After Visit Summary has been reviewed and is understood.  Full responsibility of the confidentiality of this discharge information lies with you and/or your care-partner.

## 2016-06-16 ENCOUNTER — Telehealth: Payer: Self-pay | Admitting: *Deleted

## 2016-06-16 NOTE — Telephone Encounter (Signed)
  Follow up Call-  Call back number 06/15/2016  Post procedure Call Back phone  # [phone]  Permission to leave phone message Yes  Some recent data might be hidden     Patient questions:  Do you have a fever, pain , or abdominal swelling? No. Pain Score  0 *  Have you tolerated food without any problems? Yes.    Have you been able to return to your normal activities? Yes.    Do you have any questions about your discharge instructions: Diet   No. Medications  No. Follow up visit  No.  Do you have questions or concerns about your Care? No.  Actions: * If pain score is 4 or above: No action needed, pain <4.

## 2016-06-28 ENCOUNTER — Encounter: Payer: Self-pay | Admitting: Gastroenterology

## 2018-09-13 DIAGNOSIS — S56429A Laceration of extensor muscle, fascia and tendon of unspecified finger at forearm level, initial encounter: Secondary | ICD-10-CM | POA: Insufficient documentation

## 2021-04-28 ENCOUNTER — Encounter: Payer: Self-pay | Admitting: Gastroenterology

## 2021-06-24 ENCOUNTER — Ambulatory Visit (AMBULATORY_SURGERY_CENTER): Payer: Self-pay | Admitting: *Deleted

## 2021-06-24 ENCOUNTER — Other Ambulatory Visit: Payer: Self-pay

## 2021-06-24 VITALS — Ht 70.0 in | Wt 182.0 lb

## 2021-06-24 DIAGNOSIS — Z800 Family history of malignant neoplasm of digestive organs: Secondary | ICD-10-CM

## 2021-06-24 DIAGNOSIS — Z86010 Personal history of colonic polyps: Secondary | ICD-10-CM

## 2021-06-24 MED ORDER — NA SULFATE-K SULFATE-MG SULF 17.5-3.13-1.6 GM/177ML PO SOLN: 1.0000 | ORAL | 0 refills | Status: DC

## 2021-06-24 NOTE — Progress Notes (Signed)

## 2021-07-08 ENCOUNTER — Encounter: Payer: BLUE CROSS/BLUE SHIELD | Admitting: Gastroenterology

## 2021-07-13 ENCOUNTER — Other Ambulatory Visit: Payer: Self-pay

## 2021-07-13 ENCOUNTER — Encounter: Payer: Self-pay | Admitting: Gastroenterology

## 2021-07-13 ENCOUNTER — Ambulatory Visit (AMBULATORY_SURGERY_CENTER): Payer: Medicare Other | Admitting: Gastroenterology

## 2021-07-13 VITALS — BP 121/68 | HR 63 | Temp 97.8°F | Resp 18 | Ht 70.0 in | Wt 182.0 lb

## 2021-07-13 DIAGNOSIS — D122 Benign neoplasm of ascending colon: Secondary | ICD-10-CM

## 2021-07-13 DIAGNOSIS — D124 Benign neoplasm of descending colon: Secondary | ICD-10-CM

## 2021-07-13 DIAGNOSIS — D125 Benign neoplasm of sigmoid colon: Secondary | ICD-10-CM

## 2021-07-13 DIAGNOSIS — D123 Benign neoplasm of transverse colon: Secondary | ICD-10-CM

## 2021-07-13 DIAGNOSIS — Z800 Family history of malignant neoplasm of digestive organs: Secondary | ICD-10-CM

## 2021-07-13 DIAGNOSIS — Z86010 Personal history of colonic polyps: Secondary | ICD-10-CM | POA: Diagnosis not present

## 2021-07-13 MED ORDER — SODIUM CHLORIDE 0.9 % IV SOLN: 500.0000 mL | Freq: Once | INTRAVENOUS | Status: DC

## 2021-07-13 NOTE — Op Note (Signed)
Benjamin Lloyd: Benjamin Lloyd Procedure Date: 07/13/2021 9:33 AM MRN: [id number] Endoscopist: Ladene Artist , MD Age: 65 Referring MD:  Date of Birth: November 28, 1955 Gender: Male Account #: [id number] Procedure:                Colonoscopy Indications:              Surveillance: Personal history of adenomatous                            polyps on last colonoscopy 5 years ago. Family                            history of colon cancer, first degree relative. Medicines:                Monitored Anesthesia Care Procedure:                Pre-Anesthesia Assessment:                           - Prior to the procedure, a History and Physical                            was performed, and patient medications and                            allergies were reviewed. The patient's tolerance of                            previous anesthesia was also reviewed. The risks                            and benefits of the procedure and the sedation                            options and risks were discussed with the patient.                            All questions were answered, and informed consent                            was obtained. Prior Anticoagulants: The patient has                            taken no previous anticoagulant or antiplatelet                            agents. ASA Grade Assessment: II - A patient with                            mild systemic disease. After reviewing the risks                            and benefits, the patient was deemed in  satisfactory condition to undergo the procedure.                           After obtaining informed consent, the colonoscope                            was passed under direct vision. Throughout the                            procedure, the patient's blood pressure, pulse, and                            oxygen saturations were monitored continuously. The                            CF HQ190L #[id number]  was introduced through the anus                            and advanced to the the cecum, identified by                            appendiceal orifice and ileocecal valve. The                            ileocecal valve, appendiceal orifice, and rectum                            were photographed. The quality of the bowel                            preparation was good. The colonoscopy was performed                            without difficulty. The patient tolerated the                            procedure well. Scope In: 2:50:53 AM Scope Out: 10:00:05 AM Scope Withdrawal Time: 0 hours 12 minutes 10 seconds  Total Procedure Duration: 0 hours 13 minutes 31 seconds  Findings:                 The perianal and digital rectal examinations were                            normal.                           Four sessile polyps were found in the sigmoid                            colon, descending colon, transverse colon and                            ascending colon. The polyps were 6 to 7 mm in size.  These polyps were removed with a cold snare.                            Resection and retrieval were complete.                           Internal hemorrhoids were found during                            retroflexion. The hemorrhoids were small and Grade                            I (internal hemorrhoids that do not prolapse).                           The exam was otherwise without abnormality on                            direct and retroflexion views. Complications:            No immediate complications. Estimated blood loss:                            None. Estimated Blood Loss:     Estimated blood loss: none. Impression:               - Four 6 to 7 mm polyps in the sigmoid colon, in                            the descending colon, in the transverse colon and                            in the ascending colon, removed with a cold snare.                             Resected and retrieved.                           - Internal hemorrhoids.                           - The examination was otherwise normal on direct                            and retroflexion views. Recommendation:           - Repeat colonoscopy after studies are complete for                            surveillance based on pathology results.                           - Patient has a contact number available for                            emergencies. The signs and symptoms of  potential                            delayed complications were discussed with the                            patient. Return to normal activities tomorrow.                            Written discharge instructions were provided to the                            patient.                           - Resume previous diet.                           - Continue present medications.                           - Await pathology results. Ladene Artist, MD 07/13/2021 10:03:18 AM This report has been signed electronically.

## 2021-07-13 NOTE — Progress Notes (Signed)
History & Physical  Primary Care Physician:  Josem Kaufmann, MD Primary Gastroenterologist: Lucio Edward, MD  CHIEF COMPLAINT:  Personal history of colon polyps, family history of colon cancer  HPI: Benjamin Lloyd is a 65 y.o. male with a personal history of adenomatous colon polyps and a family history of colon cancer presenting for colonoscopy.    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   Depression    GERD (gastroesophageal reflux disease)    Hypertension    Sleep apnea 09/05/2010   mild-mod-does use a cpap-will bring dos    Past Surgical History:  Procedure Laterality Date   APPENDECTOMY  09/05/1968   COLONOSCOPY  2017   FINGER EXPLORATION  09/05/2008   mass lt finger   FOOT ARTHROTOMY     left   SHOULDER ARTHROSCOPY WITH DISTAL CLAVICLE RESECTION  07/17/2012   Procedure: SHOULDER ARTHROSCOPY WITH DISTAL CLAVICLE RESECTION;  Surgeon: Cammie Sickle., MD;  Location: [facility];  Service: Orthopedics;  Laterality: Right;  Right Shoulder Arthroscopy; Superior Labrum Anterior-Posterior Debridement / Repair; Subacromial Decompression and Distal Clavicle Resection   SHOULDER ARTHROSCOPY WITH SUBACROMIAL DECOMPRESSION  07/17/2012   Procedure: SHOULDER ARTHROSCOPY WITH SUBACROMIAL DECOMPRESSION;  Surgeon: Cammie Sickle., MD;  Location: [facility];  Service: Orthopedics;  Laterality: Right;  Right Shoulder Arthroscopy; Superior Labrum Anterior-Posterior Debridement / Repair; Subacromial Decompression and Distal Clavicle Resection   UPPER GI ENDOSCOPY      Prior to Admission medications   Medication Sig Start Date End Date Taking? Authorizing Provider  ALPRAZolam Duanne Moron) 0.5 MG tablet Take by mouth. 11/19/19  Yes [provider]  busPIRone (BUSPAR) 10 MG tablet Take 10 mg by mouth daily.   Yes [provider]  escitalopram (LEXAPRO) 10 MG tablet Take 10 mg by mouth daily.   Yes  [provider]  fenofibrate (TRICOR) 145 MG tablet Take 145 mg by mouth daily.   Yes [provider]  ibuprofen (ADVIL,MOTRIN) 800 MG tablet Take 1 tablet (800 mg total) by mouth 3 (three) times daily. 05/11/16  Yes Delsa Grana, PA-C  losartan (COZAAR) 50 MG tablet Take by mouth. 06/16/21  Yes [provider]  omeprazole (PRILOSEC) 40 MG capsule Take 40 mg by mouth daily.     Yes [provider]  PARoxetine (PAXIL) 20 MG tablet  See Instructions, TAKE 1 TABLET BY MOUTH EVERY DAY, # 90 tab, 1 Refill(s), Pharmacy: [id number] 06/01/21  Yes [provider]  tamsulosin (FLOMAX) 0.4 MG CAPS capsule Take by mouth. 06/29/21  Yes [provider]  triamcinolone cream (KENALOG) 0.1 % APPLY ON THE SKIN TWICE A DAY Patient not taking: Reported on 07/13/2021 05/31/16   [provider]    Current Outpatient Medications  Medication Sig Dispense Refill   ALPRAZolam (XANAX) 0.5 MG tablet Take by mouth.     busPIRone (BUSPAR) 10 MG tablet Take 10 mg by mouth daily.     escitalopram (LEXAPRO) 10 MG tablet Take 10 mg by mouth daily.     fenofibrate (TRICOR) 145 MG tablet Take 145 mg by mouth daily.     ibuprofen (ADVIL,MOTRIN) 800 MG tablet Take 1 tablet (800 mg total) by mouth 3 (three) times daily. 21 tablet 0   losartan (COZAAR) 50 MG tablet Take by mouth.     omeprazole (PRILOSEC) 40 MG capsule Take 40 mg by mouth daily.       PARoxetine (  PAXIL) 20 MG tablet  See Instructions, TAKE 1 TABLET BY MOUTH EVERY DAY, # 90 tab, 1 Refill(s), Pharmacy: [id number]     tamsulosin (FLOMAX) 0.4 MG CAPS capsule Take by mouth.     triamcinolone cream (KENALOG) 0.1 % APPLY ON THE SKIN TWICE A DAY (Patient not taking: Reported on 07/13/2021)  0   Current Facility-Administered Medications  Medication Dose Route Frequency Provider Last Rate Last Admin   0.9 %  sodium chloride infusion  500 mL Intravenous Once Ladene Artist, MD        Allergies  as of 07/13/2021 - Review Complete 07/13/2021  Allergen Reaction Noted   Other  06/06/2016    Family History  Problem Relation Age of Onset   Colon cancer Mother 60   Heart disease Father     Social History   Socioeconomic History   Marital status: Married    Spouse name: Not on file   Number of children: 2    Years of education: Not on file   Highest education level: Not on file  Occupational History   Occupation: System Tech  Tobacco Use   Smoking status: Former    Types: Cigarettes    Quit date: 07/13/1981    Years since quitting: 40.0   Smokeless tobacco: Never  Vaping Use   Vaping Use: Never used  Substance and Sexual Activity   Alcohol use: Yes    Alcohol/week: 5.0 standard drinks    Types: 5 Standard drinks or equivalent per week   Drug use: No   Sexual activity: Not on file  Other Topics Concern   Not on file  Social History Narrative   3-4 caffeine drinks daily    Social Determinants of Health   Financial Resource Strain: Not on file  Food Insecurity: Not on file  Transportation Needs: Not on file  Physical Activity: Not on file  Stress: Not on file  Social Connections: Not on file  Intimate Partner Violence: Not on file    Review of Systems:  All systems reviewed an negative except where noted in HPI.  Gen: Denies any fever, chills, sweats, anorexia, fatigue, weakness, malaise, weight loss, and sleep disorder CV: Denies chest pain, angina, palpitations, syncope, orthopnea, PND, peripheral edema, and claudication. Resp: Denies dyspnea at rest, dyspnea with exercise, cough, sputum, wheezing, coughing up blood, and pleurisy. GI: Denies vomiting blood, jaundice, and fecal incontinence.   Denies dysphagia or odynophagia. GU : Denies urinary burning, blood in urine, urinary frequency, urinary hesitancy, nocturnal urination, and urinary incontinence. MS: Denies joint pain, limitation of movement, and swelling, stiffness, low back pain, extremity pain.  Denies muscle weakness, cramps, atrophy.  Derm: Denies rash, itching, dry skin, hives, moles, warts, or unhealing ulcers.  Psych: Denies depression, anxiety, memory loss, suicidal ideation, hallucinations, paranoia, and confusion. Heme: Denies bruising, bleeding, and enlarged lymph nodes. Neuro:  Denies any headaches, dizziness, paresthesias. Endo:  Denies any problems with DM, thyroid, adrenal function.   Physical Exam: General:  Alert, well-developed, in NAD Head:  Normocephalic and atraumatic. Eyes:  Sclera clear, no icterus.   Conjunctiva pink. Ears:  Normal auditory acuity. Mouth:  No deformity or lesions.  Neck:  Supple; no masses . Lungs:  Clear throughout to auscultation.   No wheezes, crackles, or rhonchi. No acute distress. Heart:  Regular rate and rhythm; no murmurs. Abdomen:  Soft, nondistended, nontender. No masses, hepatomegaly. No obvious masses.  Normal bowel .    Rectal:  Deferred   Msk:  Symmetrical  without gross deformities.. Pulses:  Normal pulses noted. Extremities:  Without edema. Neurologic:  Alert and  oriented x4;  grossly normal neurologically. Skin:  Intact without significant lesions or rashes. Cervical Nodes:  No significant cervical adenopathy. Psych:  Alert and cooperative. Normal mood and affect.   Impression / Plan:   Personal history of adenomatous colon polyps, family history of colon cancer, first-degree relative for colonoscopy.    Pricilla Riffle. Fuller Plan  07/13/2021, 9:43 AM See Shea Evans, [facility], to contact our on call provider

## 2021-07-13 NOTE — Progress Notes (Signed)
Report to PACU, RN, vss, BBS= Clear.  

## 2021-07-13 NOTE — Patient Instructions (Signed)
Information on polyps and hemorrhoids given to you today.  Await pathology results.  Resume previous diet and medications.   YOU HAD AN ENDOSCOPIC PROCEDURE TODAY AT [facility]:   Refer to the procedure report that was given to you for any specific questions about what was found during the examination.  If the procedure report does not answer your questions, please call your gastroenterologist to clarify.  If you requested that your care partner not be given the details of your procedure findings, then the procedure report has been included in a sealed envelope for you to review at your convenience later.  YOU SHOULD EXPECT: Some feelings of bloating in the abdomen. Passage of more gas than usual.  Walking can help get rid of the air that was put into your GI tract during the procedure and reduce the bloating. If you had a lower endoscopy (such as a colonoscopy or flexible sigmoidoscopy) you may notice spotting of blood in your stool or on the toilet paper. If you underwent a bowel prep for your procedure, you may not have a normal bowel movement for a few days.  Please Note:  You might notice some irritation and congestion in your nose or some drainage.  This is from the oxygen used during your procedure.  There is no need for concern and it should clear up in a day or so.  SYMPTOMS TO REPORT IMMEDIATELY:  Following lower endoscopy (colonoscopy or flexible sigmoidoscopy):  Excessive amounts of blood in the stool  Significant tenderness or worsening of abdominal pains  Swelling of the abdomen that is new, acute  Fever of 100F or higher  For urgent or emergent issues, a gastroenterologist can be reached at any hour by calling [phone]. Do not use MyChart messaging for urgent concerns.    DIET:  We do recommend a small meal at first, but then you may proceed to your regular diet.  Drink plenty of fluids but you should avoid alcoholic beverages for 24  hours.  ACTIVITY:  You should plan to take it easy for the rest of today and you should NOT DRIVE or use heavy machinery until tomorrow (because of the sedation medicines used during the test).    FOLLOW UP: Our staff will call the number listed on your records 48-72 hours following your procedure to check on you and address any questions or concerns that you may have regarding the information given to you following your procedure. If we do not reach you, we will leave a message.  We will attempt to reach you two times.  During this call, we will ask if you have developed any symptoms of COVID 19. If you develop any symptoms (ie: fever, flu-like symptoms, shortness of breath, cough etc.) before then, please call [phone].  If you test positive for Covid 19 in the 2 weeks post procedure, please call and report this information to [place].    If any biopsies were taken you will be contacted by phone or by letter within the next 1-3 weeks.  Please call us at [phone] if you have not heard about the biopsies in 3 weeks.    SIGNATURES/CONFIDENTIALITY: You and/or your care partner have signed paperwork which will be entered into your electronic medical record.  These signatures attest to the fact that that the information above on your After Visit Summary has been reviewed and is understood.  Full responsibility of the confidentiality of this discharge information lies with you and/or your  care-partner.  

## 2021-07-13 NOTE — Progress Notes (Signed)
Pt's states no medical or surgical changes since previsit or office visit.  Vitals DT 

## 2021-07-13 NOTE — Progress Notes (Signed)
Called to room to assist during endoscopic procedure.  Patient ID and intended procedure confirmed with present staff. Received instructions for my participation in the procedure from the performing physician.  

## 2021-07-15 ENCOUNTER — Telehealth: Payer: Self-pay

## 2021-07-15 NOTE — Telephone Encounter (Signed)
  Follow up Call-  Call back number 07/13/2021  Post procedure Call Back phone  # [phone]  Permission to leave phone message Yes  Some recent data might be hidden     Patient questions:  Do you have a fever, pain , or abdominal swelling? No. Pain Score  0 *  Have you tolerated food without any problems? Yes.    Have you been able to return to your normal activities? Yes.    Do you have any questions about your discharge instructions: Diet   No. Medications  No. Follow up visit  No.  Do you have questions or concerns about your Care? No.  Actions: * If pain score is 4 or above: No action needed, pain <4.  Have you developed a fever since your procedure? no  2.   Have you had an respiratory symptoms (SOB or cough) since your procedure? no  3.   Have you tested positive for COVID 19 since your procedure no  4.   Have you had any family members/close contacts diagnosed with the COVID 19 since your procedure?  no   If yes to any of these questions please route to Joylene John, RN and Joella Prince, RN

## 2021-07-26 ENCOUNTER — Encounter: Payer: Self-pay | Admitting: Gastroenterology

## 2021-08-13 ENCOUNTER — Telehealth: Payer: Self-pay

## 2021-08-13 NOTE — Telephone Encounter (Signed)
NOTES SCANNED TO REFERRAL 

## 2021-09-17 ENCOUNTER — Encounter: Payer: Self-pay | Admitting: Cardiology

## 2021-09-17 ENCOUNTER — Ambulatory Visit (INDEPENDENT_AMBULATORY_CARE_PROVIDER_SITE_OTHER): Payer: Medicare Other | Admitting: Cardiology

## 2021-09-17 ENCOUNTER — Other Ambulatory Visit: Payer: Self-pay

## 2021-09-17 VITALS — BP 134/70 | HR 67 | Ht 70.0 in | Wt 192.0 lb

## 2021-09-17 DIAGNOSIS — R9431 Abnormal electrocardiogram [ECG] [EKG]: Secondary | ICD-10-CM

## 2021-09-17 DIAGNOSIS — R0602 Shortness of breath: Secondary | ICD-10-CM

## 2021-09-17 DIAGNOSIS — R072 Precordial pain: Secondary | ICD-10-CM | POA: Diagnosis not present

## 2021-09-17 DIAGNOSIS — I10 Essential (primary) hypertension: Secondary | ICD-10-CM

## 2021-09-17 DIAGNOSIS — R0609 Other forms of dyspnea: Secondary | ICD-10-CM

## 2021-09-17 DIAGNOSIS — Z01812 Encounter for preprocedural laboratory examination: Secondary | ICD-10-CM

## 2021-09-17 DIAGNOSIS — I209 Angina pectoris, unspecified: Secondary | ICD-10-CM | POA: Diagnosis not present

## 2021-09-17 DIAGNOSIS — E782 Mixed hyperlipidemia: Secondary | ICD-10-CM

## 2021-09-17 MED ORDER — METOPROLOL TARTRATE 25 MG PO TABS: 25.0000 mg | ORAL_TABLET | Freq: Once | ORAL | 0 refills | Status: DC

## 2021-09-17 NOTE — Assessment & Plan Note (Signed)
Losartan 50 mg once a day.  Overall reasonable control.

## 2021-09-17 NOTE — Assessment & Plan Note (Signed)
Nonspecific ST T wave changes on ECG.

## 2021-09-17 NOTE — Assessment & Plan Note (Signed)
As described above.  Checking coronary CT scan.

## 2021-09-17 NOTE — Assessment & Plan Note (Signed)
We will check an echocardiogram to ensure proper structure and function of his heart.  I will also check a coronary CT scan with possible FFR analysis.  His brother recently had a coronary CT scan which showed mild to moderate plaque.  His shortness of breath may be an anginal equivalent.  If coronary plaque is present, we will likely start Crestor 20 mg once a day.  He is currently on fibrate 145 mg a day for mixed hyperlipidemia.

## 2021-09-17 NOTE — Patient Instructions (Addendum)
Medication Instructions:  The current medical regimen is effective;  continue present plan and medications.  *If you need a refill on your cardiac medications before your next appointment, please call your pharmacy*  Lab Work: Please have blood work today (BMP)  If you have labs (blood work) drawn today and your tests are completely normal, you will receive your results only by: MyChart Message (if you have MyChart) OR A paper copy in the mail If you have any lab test that is abnormal or we need to change your treatment, we will call you to review the results.   Testing/Procedures: Your physician has requested that you have an echocardiogram. Echocardiography is a painless test that uses sound waves to create images of your heart. It provides your doctor with information about the size and shape of your heart and how well your hearts chambers and valves are working. This procedure takes approximately one hour. There are no restrictions for this procedure.    Your cardiac CT will be scheduled at:   [facility] [address] [place], [place] [address] [phone]  Please arrive at the [facility] main entrance (entrance A) of [facility] 30 minutes prior to test start time. You can use the FREE valet parking offered at the main entrance (encouraged to control the heart rate for the test) Proceed to the [facility] Radiology Department (first floor) to check-in and test prep.  Please follow these instructions carefully (unless otherwise directed):  Hold all erectile dysfunction medications at least 3 days (72 hrs) prior to test.  On the Night Before the Test: Be sure to Drink plenty of water. Do not consume any caffeinated/decaffeinated beverages or chocolate 12 hours prior to your test. Do not take any antihistamines 12 hours prior to your test.  On the Day of the Test: Drink plenty of water until 1 hour prior to the test. Do not eat any food 4 hours  prior to the test. You may take your regular medications prior to the test.  Take metoprolol (Lopressor) two hours prior to test. HOLD Furosemide/Hydrochlorothiazide morning of the test.  After the Test: Drink plenty of water. After receiving IV contrast, you may experience a mild flushed feeling. This is normal. On occasion, you may experience a mild rash up to 24 hours after the test. This is not dangerous. If this occurs, you can take Benadryl 25 mg and increase your fluid intake. If you experience trouble breathing, this can be serious. If it is severe call 911 IMMEDIATELY. If it is mild, please call our office.  Please allow 2-4 weeks for scheduling of routine cardiac CTs. Some insurance companies require a pre-authorization which may delay scheduling of this test.   For non-scheduling related questions, please contact the cardiac imaging nurse navigator should you have any questions/concerns: Marchia Bond, Cardiac Imaging Nurse Navigator Gordy Clement, Cardiac Imaging Nurse Navigator [facility] Direct Office Dial: [phone]   For scheduling needs, including cancellations and rescheduling, please call [place], [phone].  Follow-Up: At [facility], you and your health needs are our priority.  As part of our continuing mission to provide you with exceptional heart care, we have created designated Provider Care Teams.  These Care Teams include your primary Cardiologist (physician) and Advanced Practice Providers (APPs -  Physician Assistants and Nurse Practitioners) who all work together to provide you with the care you need, when you need it.  We recommend signing up for the patient portal  called "MyChart".  Sign up information is provided on this After Visit Summary.  MyChart is used to connect with patients for Virtual Visits (Telemedicine).  Patients are able to view lab/test results, encounter notes, upcoming appointments, etc.  Non-urgent  messages can be sent to your provider as well.   To learn more about what you can do with MyChart, go to [email or web address].    Your next appointment:   Follow up will be based on the results of the above testing.  Thank you for choosing [facility]!!

## 2021-09-17 NOTE — Progress Notes (Addendum)
Cardiology Office Note:    Date:  09/17/2021   ID:  Benjamin Lloyd, DOB July 29, 1956, MRN [id number]  PCP:  Josem Kaufmann, MD   [facility] Providers Cardiologist:  Candee Furbish, MD     Referring MD: Josem Kaufmann, MD    History of Present Illness:    Benjamin Lloyd is a 66 y.o. male here for the evaluation of shortness of breath, dyspnea on exertion at the request of Dr. Pat Kocher.  Has history of hyperlipidemia, thalassemia minor, depression/anxiety, hypertension.  Former smoker, father had stroke mother had cardiovascular disease.  Walk to garden and back and feel short winded.  This seems unusual for him.  Moderate in intensity.  Relieved with rest.  Has an apple watch, heart rate goes up to 110 during this walk.  Father died 15 CVA Mom 79 Colon CA, HTN  Maybe some right sided chest discomfort, pain, mild when walking back and forth from the garden.   Quit cigs in 1982. Enjoys a cigar every now and then.  Hemoglobin 13.6 LDL 115 triglycerides 179 potassium 4.5 creatinine 1.0 LDL 99  EKG performed on 10/21/2020 also showed sinus rhythm with nonspecific ST-T wave changes, 73 bpm.  I saw his brother as well, had coronary CT scan with moderate CAD.    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   BPH (benign prostatic hyperplasia)    Depression    GERD (gastroesophageal reflux disease)    Hypertension    Metatarsal fracture    Sleep apnea 09/05/2010   mild-mod-does use a cpap-will bring dos    Past Surgical History:  Procedure Laterality Date   APPENDECTOMY  09/05/1968   COLONOSCOPY  2017   FINGER EXPLORATION  09/05/2008   mass lt finger   FOOT ARTHROTOMY     left   SHOULDER ARTHROSCOPY WITH DISTAL CLAVICLE RESECTION  07/17/2012   Procedure: SHOULDER ARTHROSCOPY WITH DISTAL CLAVICLE RESECTION;  Surgeon: Cammie Sickle., MD;  Location: [facility];  Service: Orthopedics;  Laterality: Right;  Right  Shoulder Arthroscopy; Superior Labrum Anterior-Posterior Debridement / Repair; Subacromial Decompression and Distal Clavicle Resection   SHOULDER ARTHROSCOPY WITH SUBACROMIAL DECOMPRESSION  07/17/2012   Procedure: SHOULDER ARTHROSCOPY WITH SUBACROMIAL DECOMPRESSION;  Surgeon: Cammie Sickle., MD;  Location: [facility];  Service: Orthopedics;  Laterality: Right;  Right Shoulder Arthroscopy; Superior Labrum Anterior-Posterior Debridement / Repair; Subacromial Decompression and Distal Clavicle Resection   UPPER GI ENDOSCOPY      Current Medications: Current Meds  Medication Sig   ALPRAZolam (XANAX) 0.5 MG tablet Take by mouth.   busPIRone (BUSPAR) 10 MG tablet Take 10 mg by mouth daily.   escitalopram (LEXAPRO) 10 MG tablet Take 10 mg by mouth daily.   fenofibrate (TRICOR) 145 MG tablet Take 145 mg by mouth daily.   ibuprofen (ADVIL,MOTRIN) 800 MG tablet Take 1 tablet (800 mg total) by mouth 3 (three) times daily.   losartan (COZAAR) 50 MG tablet Take by mouth.   metoprolol tartrate (LOPRESSOR) 25 MG tablet Take 1 tablet (25 mg total) by mouth once for 1 dose. Take one tablet 2 hours before your CT scan   omeprazole (PRILOSEC) 40 MG capsule Take 40 mg by mouth daily.     PARoxetine (PAXIL) 20 MG tablet  See Instructions, TAKE 1 TABLET BY MOUTH EVERY DAY, # 90 tab, 1 Refill(s), Pharmacy: [id number]   tamsulosin (FLOMAX) 0.4 MG CAPS  capsule Take by mouth.   triamcinolone cream (KENALOG) 0.1 %      Allergies:   Aspirin, Other, Plavix [clopidogrel], and Sulfa antibiotics   Social History   Socioeconomic History   Marital status: Married    Spouse name: Not on file   Number of children: 2    Years of education: Not on file   Highest education level: Not on file  Occupational History   Occupation: System Tech  Tobacco Use   Smoking status: Former    Types: Cigarettes    Quit date: 07/13/1981    Years since quitting: 40.2   Smokeless tobacco: Never  Vaping Use    Vaping Use: Never used  Substance and Sexual Activity   Alcohol use: Yes    Alcohol/week: 5.0 standard drinks    Types: 5 Standard drinks or equivalent per week   Drug use: No   Sexual activity: Not on file  Other Topics Concern   Not on file  Social History Narrative   3-4 caffeine drinks daily    Social Determinants of Health   Financial Resource Strain: Not on file  Food Insecurity: Not on file  Transportation Needs: Not on file  Physical Activity: Not on file  Stress: Not on file  Social Connections: Not on file     Family History: The patient's family history includes Colon cancer (age of onset: 40) in his mother; Heart attack in his mother; Heart disease in his father and mother; Hyperlipidemia in his brother and mother; Hypertension in his father; Stroke in his father.  ROS:   Please see the history of present illness.    No fevers chills nausea vomiting syncope bleeding all other systems reviewed and are negative.  EKGs/Labs/Other Studies Reviewed:    The following studies were reviewed today: EKG, prior office notes, lab work reviewed, prior creatinine was 0.8 back in 2019  EKG:  EKG is  ordered today.  The ekg ordered today demonstrates sinus rhythm 67 nonspecific ST-T wave changes  Recent Labs: No results found for requested labs within last 8760 hours.  Recent Lipid Panel No results found for: CHOL, TRIG, HDL, CHOLHDL, VLDL, LDLCALC, LDLDIRECT   Risk Assessment/Calculations:              Physical Exam:    VS:  BP 134/70 (BP Location: Left Arm, Patient Position: Sitting, Cuff Size: Normal)    Pulse 67    Ht 5\' 10"  (1.778 m)    Wt 192 lb (87.1 kg)    SpO2 98%    BMI 27.55 kg/m     Wt Readings from Last 3 Encounters:  09/17/21 192 lb (87.1 kg)  07/13/21 182 lb (82.6 kg)  06/24/21 182 lb (82.6 kg)     GEN:  Well nourished, well developed in no acute distress HEENT: Normal NECK: No JVD; No carotid bruits LYMPHATICS: No lymphadenopathy CARDIAC:  RRR, no murmurs, no rubs, gallops RESPIRATORY:  Clear to auscultation without rales, wheezing or rhonchi  ABDOMEN: Soft, non-tender, non-distended MUSCULOSKELETAL:  No edema; No deformity  SKIN: Warm and dry NEUROLOGIC:  Alert and oriented x 3 PSYCHIATRIC:  Normal affect   ASSESSMENT:    1. Nonspecific abnormal electrocardiogram (ECG) (EKG)   2. Precordial pain   3. Shortness of breath   4. Pre-procedure lab exam   5. Dyspnea on exertion   6. Mixed hyperlipidemia   7. Primary hypertension   8. Angina pectoris ([place])    PLAN:    In order of problems  listed above:  Dyspnea on exertion We will check an echocardiogram to ensure proper structure and function of his heart.  I will also check a coronary CT scan with possible FFR analysis.  His brother recently had a coronary CT scan which showed mild to moderate plaque.  His shortness of breath may be an anginal equivalent.  If coronary plaque is present, we will likely start Crestor 20 mg once a day.  He is currently on fibrate 145 mg a day for mixed hyperlipidemia.  Mixed hyperlipidemia Currently on fibrate 145 mg a day.  If coronary plaque is present, we will start Crestor.   Primary hypertension Losartan 50 mg once a day.  Overall reasonable control.  Nonspecific abnormal electrocardiogram (ECG) (EKG) Nonspecific ST T wave changes on ECG.  Angina pectoris ([place]) As described above.  Checking coronary CT scan.   We will follow-up with results of study.      Medication Adjustments/Labs and Tests Ordered: Current medicines are reviewed at length with the patient today.  Concerns regarding medicines are outlined above.  Orders Placed This Encounter  Procedures   CT CORONARY MORPH W/CTA COR W/SCORE W/CA W/CM &/OR WO/CM   Basic metabolic panel   EKG 96-VELF   ECHOCARDIOGRAM COMPLETE   Meds ordered this encounter  Medications   metoprolol tartrate (LOPRESSOR) 25 MG tablet    Sig: Take 1 tablet (25 mg total) by mouth once  for 1 dose. Take one tablet 2 hours before your CT scan    Dispense:  1 tablet    Refill:  0    Patient Instructions  Medication Instructions:  The current medical regimen is effective;  continue present plan and medications.  *If you need a refill on your cardiac medications before your next appointment, please call your pharmacy*  Lab Work: Please have blood work today (BMP)  If you have labs (blood work) drawn today and your tests are completely normal, you will receive your results only by: MyChart Message (if you have MyChart) OR A paper copy in the mail If you have any lab test that is abnormal or we need to change your treatment, we will call you to review the results.   Testing/Procedures: Your physician has requested that you have an echocardiogram. Echocardiography is a painless test that uses sound waves to create images of your heart. It provides your doctor with information about the size and shape of your heart and how well your hearts chambers and valves are working. This procedure takes approximately one hour. There are no restrictions for this procedure.    Your cardiac CT will be scheduled at:   [facility] [address] [place], [place] [address] [phone]  Please arrive at the [facility] main entrance (entrance A) of [facility] 30 minutes prior to test start time. You can use the FREE valet parking offered at the main entrance (encouraged to control the heart rate for the test) Proceed to the [facility] Radiology Department (first floor) to check-in and test prep.  Please follow these instructions carefully (unless otherwise directed):  Hold all erectile dysfunction medications at least 3 days (72 hrs) prior to test.  On the Night Before the Test: Be sure to Drink plenty of water. Do not consume any caffeinated/decaffeinated beverages or chocolate 12 hours prior to your test. Do not take any antihistamines 12 hours prior to  your test.  On the Day of the Test: Drink plenty of water until 1 hour prior  to the test. Do not eat any food 4 hours prior to the test. You may take your regular medications prior to the test.  Take metoprolol (Lopressor) two hours prior to test. HOLD Furosemide/Hydrochlorothiazide morning of the test.  After the Test: Drink plenty of water. After receiving IV contrast, you may experience a mild flushed feeling. This is normal. On occasion, you may experience a mild rash up to 24 hours after the test. This is not dangerous. If this occurs, you can take Benadryl 25 mg and increase your fluid intake. If you experience trouble breathing, this can be serious. If it is severe call 911 IMMEDIATELY. If it is mild, please call our office.  Please allow 2-4 weeks for scheduling of routine cardiac CTs. Some insurance companies require a pre-authorization which may delay scheduling of this test.   For non-scheduling related questions, please contact the cardiac imaging nurse navigator should you have any questions/concerns: Marchia Bond, Cardiac Imaging Nurse Navigator Gordy Clement, Cardiac Imaging Nurse Navigator [facility] Direct Office Dial: [phone]   For scheduling needs, including cancellations and rescheduling, please call [place], [phone].  Follow-Up: At [facility], you and your health needs are our priority.  As part of our continuing mission to provide you with exceptional heart care, we have created designated Provider Care Teams.  These Care Teams include your primary Cardiologist (physician) and Advanced Practice Providers (APPs -  Physician Assistants and Nurse Practitioners) who all work together to provide you with the care you need, when you need it.  We recommend signing up for the patient portal called "MyChart".  Sign up information is provided on this After Visit Summary.  MyChart is used to connect with patients for Virtual Visits  (Telemedicine).  Patients are able to view lab/test results, encounter notes, upcoming appointments, etc.  Non-urgent messages can be sent to your provider as well.   To learn more about what you can do with MyChart, go to [email or web address].    Your next appointment:   Follow up will be based on the results of the above testing.  Thank you for choosing [facility]!!        Signed, Candee Furbish, MD  09/17/2021 11:00 AM    [place] [address]

## 2021-09-17 NOTE — Assessment & Plan Note (Signed)
Currently on fibrate 145 mg a day.  If coronary plaque is present, we will start Crestor.

## 2021-09-18 LAB — BASIC METABOLIC PANEL
BUN/Creatinine Ratio: 13 (ref 10–24)
BUN: 12 mg/dL (ref 8–27)
CO2: 23 mmol/L (ref 20–29)
Calcium: 9.9 mg/dL (ref 8.6–10.2)
Chloride: 101 mmol/L (ref 96–106)
Creatinine, Ser: 0.92 mg/dL (ref 0.76–1.27)
Glucose: 79 mg/dL (ref 70–99)
Potassium: 4.3 mmol/L (ref 3.5–5.2)
Sodium: 141 mmol/L (ref 134–144)
eGFR: 92 mL/min/{1.73_m2} (ref >59)

## 2021-10-01 ENCOUNTER — Telehealth (HOSPITAL_COMMUNITY): Payer: Self-pay | Admitting: *Deleted

## 2021-10-01 NOTE — Telephone Encounter (Signed)
Attempted to call patient regarding upcoming cardiac CT appointment. °Left message on voicemail with name and callback number ° °Safiatou Islam RN Navigator Cardiac Imaging °[facility] °[phone] Office °[phone] Cell ° °

## 2021-10-04 ENCOUNTER — Encounter (HOSPITAL_COMMUNITY): Payer: Self-pay

## 2021-10-04 ENCOUNTER — Ambulatory Visit (HOSPITAL_BASED_OUTPATIENT_CLINIC_OR_DEPARTMENT_OTHER): Payer: Medicare Other

## 2021-10-04 ENCOUNTER — Ambulatory Visit (HOSPITAL_COMMUNITY)
Admission: RE | Admit: 2021-10-04 | Discharge: 2021-10-04 | Disposition: A | Discharge status: Home / Self Care | Payer: Medicare Other | Source: Ambulatory Visit | Attending: Cardiology | Admitting: Cardiology

## 2021-10-04 ENCOUNTER — Other Ambulatory Visit: Payer: Self-pay

## 2021-10-04 DIAGNOSIS — R072 Precordial pain: Secondary | ICD-10-CM

## 2021-10-04 DIAGNOSIS — R9431 Abnormal electrocardiogram [ECG] [EKG]: Secondary | ICD-10-CM | POA: Diagnosis present

## 2021-10-04 DIAGNOSIS — R0602 Shortness of breath: Secondary | ICD-10-CM | POA: Diagnosis present

## 2021-10-04 LAB — ECHOCARDIOGRAM COMPLETE
Area-P 1/2: 3.03 cm2
S' Lateral: 3.1 cm

## 2021-10-04 MED ORDER — NITROGLYCERIN 0.4 MG SL SUBL
0.8000 mg | SUBLINGUAL_TABLET | Freq: Once | SUBLINGUAL | Status: AC
  Administered 2021-10-04: 0.8 mg via SUBLINGUAL

## 2021-10-04 MED ORDER — IOHEXOL 350 MG/ML SOLN
95.0000 mL | Freq: Once | INTRAVENOUS | Status: AC | PRN
  Administered 2021-10-04: 95 mL via INTRAVENOUS

## 2021-10-04 MED ORDER — NITROGLYCERIN 0.4 MG SL SUBL
SUBLINGUAL_TABLET | SUBLINGUAL | Status: AC
  Filled 2021-10-04: qty 2

## 2021-10-04 NOTE — Progress Notes (Signed)
CT scan completed. Tolerated well. D/c home ambulatory, awake and alert. In no distress 

## 2021-10-13 ENCOUNTER — Telehealth: Payer: Self-pay | Admitting: Cardiology

## 2021-10-13 DIAGNOSIS — Z79899 Other long term (current) drug therapy: Secondary | ICD-10-CM

## 2021-10-13 DIAGNOSIS — E782 Mixed hyperlipidemia: Secondary | ICD-10-CM

## 2021-10-13 MED ORDER — ROSUVASTATIN CALCIUM 10 MG PO TABS: 10.0000 mg | ORAL_TABLET | Freq: Every day | ORAL | 2 refills | Status: DC

## 2021-10-13 NOTE — Telephone Encounter (Signed)
Patient states he received a MyChart message telling he Dr. Marlou Porch wants him to start crestor.  [facility] would [facility] to [id number] [place], [place]

## 2021-10-13 NOTE — Telephone Encounter (Signed)
Jerline Pain, MD  10/05/2021  4:13 PM EST     Mild nonobstructive calcified plaque of left main and LAD. Calcium score of 9 which was 25th percentile.   Given the calcified plaque, lets go ahead and start Crestor 10 mg once a day and recheck lipid panel and ALT in 3 months.  Lets shoot for a LDL goal of less than 70.   Candee Furbish, MD     The patient has been notified of the result and verbalized understanding.  All questions (if any) were answered.  Pt aware to start taking crestor 10 mg po daily. He will come in for repeat lipids and ALT in 3 months on 01/10/22.  He is aware to come fasting to this lab appt.  Confirmed the pharmacy of choice with the pt.  Pt verbalized understanding and agrees with this plan.

## 2021-12-14 DIAGNOSIS — F419 Anxiety disorder, unspecified: Secondary | ICD-10-CM | POA: Insufficient documentation

## 2021-12-14 DIAGNOSIS — G510 Bell's palsy: Secondary | ICD-10-CM | POA: Insufficient documentation

## 2022-01-10 ENCOUNTER — Other Ambulatory Visit: Payer: Medicare Other

## 2022-01-14 ENCOUNTER — Other Ambulatory Visit: Payer: Medicare Other | Admitting: *Deleted

## 2022-01-14 DIAGNOSIS — E782 Mixed hyperlipidemia: Secondary | ICD-10-CM

## 2022-01-14 DIAGNOSIS — Z79899 Other long term (current) drug therapy: Secondary | ICD-10-CM

## 2022-01-14 LAB — LIPID PANEL
Chol/HDL Ratio: 4.1 ratio (ref 0.0–5.0)
Cholesterol, Total: 171 mg/dL (ref 100–199)
HDL: 42 mg/dL (ref >39)
LDL Chol Calc (NIH): 101 mg/dL — ABNORMAL HIGH (ref 0–99)
Triglycerides: 158 mg/dL — ABNORMAL HIGH (ref 0–149)
VLDL Cholesterol Cal: 28 mg/dL (ref 5–40)

## 2022-01-14 LAB — ALT: ALT: 33 IU/L (ref 0–44)

## 2022-01-15 ENCOUNTER — Other Ambulatory Visit: Payer: Self-pay | Admitting: Cardiology

## 2022-01-15 DIAGNOSIS — Z79899 Other long term (current) drug therapy: Secondary | ICD-10-CM

## 2022-01-15 DIAGNOSIS — E782 Mixed hyperlipidemia: Secondary | ICD-10-CM

## 2022-01-15 MED ORDER — ROSUVASTATIN CALCIUM 20 MG PO TABS: 20.0000 mg | ORAL_TABLET | Freq: Every day | ORAL | 2 refills | Status: DC

## 2022-01-18 ENCOUNTER — Telehealth: Payer: Self-pay

## 2022-01-18 DIAGNOSIS — E782 Mixed hyperlipidemia: Secondary | ICD-10-CM

## 2022-01-18 NOTE — Telephone Encounter (Signed)
Patient will double his crestor 10 mg dose and then let pharmacist know when he is ready for his new script. Lab slip entered for [facility] outpatient lab to be done mid July ?

## 2022-01-18 NOTE — Telephone Encounter (Signed)
-----   Message from Jerline Pain, MD sent at 01/15/2022  5:36 AM EDT ----- ?LDL 101 on Crestor '10mg'$ . ?Goal LDL < 70 with coronary calcified plaque. ? ?Let's increase Crestor to '20mg'$  [facility] a [facility] ?Recheck lipids in 2 months.  ? ?Trigs are 158 on fenofibrate '145mg'$ . ?Candee Furbish, MD ? ?

## 2022-03-11 ENCOUNTER — Encounter: Payer: Self-pay | Admitting: Cardiology

## 2022-03-11 ENCOUNTER — Telehealth: Payer: Self-pay | Admitting: Cardiology

## 2022-03-11 NOTE — Telephone Encounter (Signed)
Spoke with pt who complains of intermittent heart palpitations x 2 weeks.  He reports he has picked these up on his apple watch.  He states when he has the palpitations for a second he will "feel funny in his chest" and lightheaded but it immediately goes away.  Pt states most recent BP readings were taken right after taking Losartan this morning.  He denies current CP, SOB or dizziness.  He reports taking medications as prescribed.  Requested pt recheck his BP and send readings along with ECG apple watch recordings through MyChart message. Will forward to Dr Marlou Porch and his RN for review and recommendation.  Pt verbalizes understanding and agrees with current plan.

## 2022-03-11 NOTE — Telephone Encounter (Signed)
See strips in pt advice request please.

## 2022-03-11 NOTE — Telephone Encounter (Signed)
Patient c/o Palpitations:  High priority if patient c/o lightheadedness, shortness of breath, or chest pain  How long have you had palpitations/irregular HR/ Afib? Are you having the symptoms now? 2 weeks ago; yes   Are you currently experiencing lightheadedness, SOB or CP? Lightheadedness, SOB, uncomfortable feeling in chest  Do you have a history of afib (atrial fibrillation) or irregular heart rhythm? No   Have you checked your BP or HR? (document readings if available): 148/104; 72; 142/90  Are you experiencing any other symptoms? Dizziness

## 2022-03-14 NOTE — Telephone Encounter (Signed)
Thank you for your message seeking medical advice.* My assessment and recommendation are as follows:   I had the chance to review the strips that you sent from your Apple Watch.  They are quite helpful.  You are in normal rhythm with no evidence of atrial fibrillation.  This is good.  The beats that you describe is downward spikes are called premature ventricular contractions or PVCs.  These are benign and they will not harm you.  They can be annoying however and certainly can feel like a flutter in the chest.   Normally we treat these conservatively with decreasing caffeine, improvement in sleep hygiene, exercise.  Occasionally we can utilize medications like metoprolol to help blunt the PVCs.  I would be happy to go either way.  Please let us know.   Sincerely,  Candee Furbish, MD    The above information is from patient advice request response from Dr Marlou Porch.

## 2022-03-14 NOTE — Telephone Encounter (Signed)
Please see the MyChart message reply(ies) for my assessment and plan.    This patient gave consent for this Medical Advice Message and is aware that it may result in a bill to their insurance company, as well as the possibility of receiving a bill for a co-payment or deductible. They are an established patient, but are not seeking medical advice exclusively about a problem treated during an in person or video visit in the last seven days. I did not recommend an in person or video visit within seven days of my reply.    I spent a total of 11 minutes cumulative time within 7 days through MyChart messaging.  Angeletta Goelz, MD   

## 2022-03-16 ENCOUNTER — Other Ambulatory Visit (HOSPITAL_COMMUNITY)
Admission: RE | Admit: 2022-03-16 | Discharge: 2022-03-16 | Disposition: A | Discharge status: Home / Self Care | Payer: Medicare Other | Source: Ambulatory Visit | Attending: Cardiology | Admitting: Cardiology

## 2022-03-16 DIAGNOSIS — E782 Mixed hyperlipidemia: Secondary | ICD-10-CM | POA: Diagnosis present

## 2022-03-16 LAB — LIPID PANEL
Cholesterol: 138 mg/dL (ref 0–200)
HDL: 44 mg/dL (ref >40)
LDL Cholesterol: 75 mg/dL (ref 0–99)
Total CHOL/HDL Ratio: 3.1 RATIO
Triglycerides: 97 mg/dL (ref <150)
VLDL: 19 mg/dL (ref 0–40)

## 2022-03-21 NOTE — Progress Notes (Signed)
Pt has been made aware of normal result and verbalized understanding.  jw

## 2022-11-16 DIAGNOSIS — E559 Vitamin D deficiency, unspecified: Secondary | ICD-10-CM | POA: Insufficient documentation

## 2022-12-21 ENCOUNTER — Encounter (HOSPITAL_BASED_OUTPATIENT_CLINIC_OR_DEPARTMENT_OTHER): Payer: Self-pay | Admitting: Orthopedic Surgery

## 2022-12-21 ENCOUNTER — Other Ambulatory Visit: Payer: Self-pay

## 2022-12-21 ENCOUNTER — Other Ambulatory Visit: Payer: Self-pay | Admitting: Orthopedic Surgery

## 2022-12-26 ENCOUNTER — Ambulatory Visit (HOSPITAL_BASED_OUTPATIENT_CLINIC_OR_DEPARTMENT_OTHER): Payer: Medicare Other | Admitting: Anesthesiology

## 2022-12-26 ENCOUNTER — Other Ambulatory Visit: Payer: Self-pay

## 2022-12-26 ENCOUNTER — Encounter (HOSPITAL_BASED_OUTPATIENT_CLINIC_OR_DEPARTMENT_OTHER)
Admission: RE | Disposition: A | Discharge status: Home / Self Care | Payer: Self-pay | Source: Home / Self Care | Attending: Orthopedic Surgery

## 2022-12-26 ENCOUNTER — Ambulatory Visit (HOSPITAL_BASED_OUTPATIENT_CLINIC_OR_DEPARTMENT_OTHER): Payer: Medicare Other

## 2022-12-26 ENCOUNTER — Encounter (HOSPITAL_BASED_OUTPATIENT_CLINIC_OR_DEPARTMENT_OTHER): Payer: Self-pay | Admitting: Orthopedic Surgery

## 2022-12-26 ENCOUNTER — Ambulatory Visit (HOSPITAL_BASED_OUTPATIENT_CLINIC_OR_DEPARTMENT_OTHER)
Admission: RE | Admit: 2022-12-26 | Discharge: 2022-12-26 | Disposition: A | Discharge status: Home / Self Care | Payer: Medicare Other | Attending: Orthopedic Surgery | Admitting: Orthopedic Surgery

## 2022-12-26 DIAGNOSIS — G4733 Obstructive sleep apnea (adult) (pediatric): Secondary | ICD-10-CM | POA: Diagnosis not present

## 2022-12-26 DIAGNOSIS — L729 Follicular cyst of the skin and subcutaneous tissue, unspecified: Secondary | ICD-10-CM

## 2022-12-26 DIAGNOSIS — F419 Anxiety disorder, unspecified: Secondary | ICD-10-CM | POA: Insufficient documentation

## 2022-12-26 DIAGNOSIS — Z79899 Other long term (current) drug therapy: Secondary | ICD-10-CM | POA: Insufficient documentation

## 2022-12-26 DIAGNOSIS — M25842 Other specified joint disorders, left hand: Secondary | ICD-10-CM | POA: Insufficient documentation

## 2022-12-26 DIAGNOSIS — F17210 Nicotine dependence, cigarettes, uncomplicated: Secondary | ICD-10-CM | POA: Diagnosis not present

## 2022-12-26 DIAGNOSIS — F32A Depression, unspecified: Secondary | ICD-10-CM | POA: Insufficient documentation

## 2022-12-26 DIAGNOSIS — I10 Essential (primary) hypertension: Secondary | ICD-10-CM | POA: Diagnosis not present

## 2022-12-26 DIAGNOSIS — F17290 Nicotine dependence, other tobacco product, uncomplicated: Secondary | ICD-10-CM | POA: Insufficient documentation

## 2022-12-26 DIAGNOSIS — K219 Gastro-esophageal reflux disease without esophagitis: Secondary | ICD-10-CM | POA: Diagnosis not present

## 2022-12-26 DIAGNOSIS — Z9989 Dependence on other enabling machines and devices: Secondary | ICD-10-CM

## 2022-12-26 DIAGNOSIS — G4730 Sleep apnea, unspecified: Secondary | ICD-10-CM | POA: Insufficient documentation

## 2022-12-26 HISTORY — DX: Thalassemia minor: D56.3

## 2022-12-26 HISTORY — DX: Anxiety disorder, unspecified: F41.9

## 2022-12-26 HISTORY — PX: EXCISION METACARPAL MASS: SHX6372

## 2022-12-26 SURGERY — EXCISION METACARPAL MASS
Anesthesia: Regional | Site: Finger | Laterality: Left

## 2022-12-26 MED ORDER — LACTATED RINGERS IV SOLN: INTRAVENOUS | Status: DC

## 2022-12-26 MED ORDER — MIDAZOLAM HCL 2 MG/2ML IJ SOLN
INTRAMUSCULAR | Status: AC
  Filled 2022-12-26: qty 2

## 2022-12-26 MED ORDER — ACETAMINOPHEN 500 MG PO TABS
ORAL_TABLET | ORAL | Status: AC
  Filled 2022-12-26: qty 1

## 2022-12-26 MED ORDER — OXYCODONE HCL 5 MG/5ML PO SOLN: 5.0000 mg | Freq: Once | ORAL | Status: DC | PRN

## 2022-12-26 MED ORDER — FENTANYL CITRATE (PF) 100 MCG/2ML IJ SOLN
INTRAMUSCULAR | Status: AC
  Filled 2022-12-26: qty 2

## 2022-12-26 MED ORDER — FENTANYL CITRATE (PF) 100 MCG/2ML IJ SOLN: 100.0000 ug | Freq: Once | INTRAMUSCULAR | Status: DC

## 2022-12-26 MED ORDER — ACETAMINOPHEN 500 MG PO TABS
1000.0000 mg | ORAL_TABLET | Freq: Once | ORAL | Status: AC
  Administered 2022-12-26: 1000 mg via ORAL

## 2022-12-26 MED ORDER — MIDAZOLAM HCL 2 MG/2ML IJ SOLN: 2.0000 mg | Freq: Once | INTRAMUSCULAR | Status: DC

## 2022-12-26 MED ORDER — MIDAZOLAM HCL 5 MG/5ML IJ SOLN
INTRAMUSCULAR | Status: DC | PRN
  Administered 2022-12-26: 1 mg via INTRAVENOUS

## 2022-12-26 MED ORDER — FENTANYL CITRATE (PF) 100 MCG/2ML IJ SOLN
INTRAMUSCULAR | Status: DC | PRN
  Administered 2022-12-26: 50 ug via INTRAVENOUS

## 2022-12-26 MED ORDER — CEFAZOLIN SODIUM-DEXTROSE 2-4 GM/100ML-% IV SOLN
INTRAVENOUS | Status: AC
  Filled 2022-12-26: qty 100

## 2022-12-26 MED ORDER — PROPOFOL 500 MG/50ML IV EMUL
INTRAVENOUS | Status: DC | PRN
  Administered 2022-12-26: 100 ug/kg/min via INTRAVENOUS

## 2022-12-26 MED ORDER — LIDOCAINE HCL (PF) 0.5 % IJ SOLN
INTRAMUSCULAR | Status: DC | PRN
  Administered 2022-12-26: 30 mL via INTRAVENOUS

## 2022-12-26 MED ORDER — BUPIVACAINE HCL (PF) 0.25 % IJ SOLN
INTRAMUSCULAR | Status: DC | PRN
  Administered 2022-12-26: 9 mL

## 2022-12-26 MED ORDER — HYDROCODONE-ACETAMINOPHEN 5-325 MG PO TABS: ORAL_TABLET | ORAL | 0 refills | Status: DC

## 2022-12-26 MED ORDER — CEFAZOLIN SODIUM-DEXTROSE 2-4 GM/100ML-% IV SOLN
2.0000 g | INTRAVENOUS | Status: AC
  Administered 2022-12-26: 2 g via INTRAVENOUS

## 2022-12-26 MED ORDER — FENTANYL CITRATE (PF) 100 MCG/2ML IJ SOLN: 25.0000 ug | INTRAMUSCULAR | Status: DC | PRN

## 2022-12-26 MED ORDER — ACETAMINOPHEN 500 MG PO TABS
ORAL_TABLET | ORAL | Status: AC
  Filled 2022-12-26: qty 2

## 2022-12-26 MED ORDER — OXYCODONE HCL 5 MG PO TABS: 5.0000 mg | ORAL_TABLET | Freq: Once | ORAL | Status: DC | PRN

## 2022-12-26 SURGICAL SUPPLY — 56 items
APL PRP STRL LF DISP 70% ISPRP (MISCELLANEOUS) ×2
APL SKNCLS STERI-STRIP NONHPOA (GAUZE/BANDAGES/DRESSINGS)
BANDAGE GAUZE 1X75IN STRL (MISCELLANEOUS) IMPLANT
BENZOIN TINCTURE PRP APPL 2/3 (GAUZE/BANDAGES/DRESSINGS) IMPLANT
BLADE MINI RND TIP GREEN BEAV (BLADE) IMPLANT
BLADE SURG 15 STRL LF DISP TIS (BLADE) ×4 IMPLANT
BLADE SURG 15 STRL SS (BLADE) ×4
BNDG CMPR 5X2 CHSV 1 LYR STRL (GAUZE/BANDAGES/DRESSINGS)
BNDG CMPR 5X2 KNTD ELC UNQ LF (GAUZE/BANDAGES/DRESSINGS)
BNDG CMPR 5X3 KNIT ELC UNQ LF (GAUZE/BANDAGES/DRESSINGS)
BNDG CMPR 75X11 PLY HI ABS (MISCELLANEOUS)
BNDG CMPR 75X21 PLY HI ABS (MISCELLANEOUS)
BNDG CMPR 9X4 STRL LF SNTH (GAUZE/BANDAGES/DRESSINGS)
BNDG COHESIVE 1X5 TAN STRL LF (GAUZE/BANDAGES/DRESSINGS) IMPLANT
BNDG COHESIVE 2X5 TAN ST LF (GAUZE/BANDAGES/DRESSINGS) IMPLANT
BNDG ELASTIC 2INX 5YD STR LF (GAUZE/BANDAGES/DRESSINGS) IMPLANT
BNDG ELASTIC 3INX 5YD STR LF (GAUZE/BANDAGES/DRESSINGS) IMPLANT
BNDG ESMARK 4X9 LF (GAUZE/BANDAGES/DRESSINGS) IMPLANT
BNDG GAUZE 1X75IN STRL (MISCELLANEOUS)
BNDG GAUZE DERMACEA FLUFF 4 (GAUZE/BANDAGES/DRESSINGS) IMPLANT
BNDG GZE DERMACEA 4 6PLY (GAUZE/BANDAGES/DRESSINGS)
BNDG PLASTER X FAST 3X3 WHT LF (CAST SUPPLIES) IMPLANT
BNDG PLSTR 9X3 FST ST WHT (CAST SUPPLIES)
CHLORAPREP W/TINT 26 (MISCELLANEOUS) ×2 IMPLANT
CORD BIPOLAR FORCEPS 12FT (ELECTRODE) ×2 IMPLANT
COVER BACK TABLE 60X90IN (DRAPES) ×2 IMPLANT
COVER MAYO STAND STRL (DRAPES) ×2 IMPLANT
CUFF TOURN SGL QUICK 18X4 (TOURNIQUET CUFF) ×2 IMPLANT
DRAPE EXTREMITY T 121X128X90 (DISPOSABLE) ×2 IMPLANT
DRAPE SURG 17X23 STRL (DRAPES) ×2 IMPLANT
GAUZE SPONGE 4X4 12PLY STRL (GAUZE/BANDAGES/DRESSINGS) ×2 IMPLANT
GAUZE STRETCH 2X75IN STRL (MISCELLANEOUS) IMPLANT
GAUZE XEROFORM 1X8 LF (GAUZE/BANDAGES/DRESSINGS) ×2 IMPLANT
GLOVE BIO SURGEON STRL SZ7.5 (GLOVE) ×2 IMPLANT
GLOVE BIOGEL PI IND STRL 8 (GLOVE) ×2 IMPLANT
GOWN STRL REUS W/ TWL LRG LVL3 (GOWN DISPOSABLE) ×2 IMPLANT
GOWN STRL REUS W/TWL LRG LVL3 (GOWN DISPOSABLE) ×2
GOWN STRL REUS W/TWL XL LVL3 (GOWN DISPOSABLE) ×2 IMPLANT
NDL HYPO 25X1 1.5 SAFETY (NEEDLE) ×1 IMPLANT
NEEDLE HYPO 25X1 1.5 SAFETY (NEEDLE) ×2 IMPLANT
NS IRRIG 1000ML POUR BTL (IV SOLUTION) ×2 IMPLANT
PACK BASIN DAY SURGERY FS (CUSTOM PROCEDURE TRAY) ×2 IMPLANT
PAD CAST 3X4 CTTN HI CHSV (CAST SUPPLIES) IMPLANT
PAD CAST 4YDX4 CTTN HI CHSV (CAST SUPPLIES) IMPLANT
PADDING CAST ABS COTTON 4X4 ST (CAST SUPPLIES) ×2 IMPLANT
PADDING CAST COTTON 3X4 STRL (CAST SUPPLIES)
PADDING CAST COTTON 4X4 STRL (CAST SUPPLIES)
STOCKINETTE 4X48 STRL (DRAPES) ×2 IMPLANT
STRIP CLOSURE SKIN 1/2X4 (GAUZE/BANDAGES/DRESSINGS) IMPLANT
SUT ETHILON 3 0 PS 1 (SUTURE) IMPLANT
SUT ETHILON 4 0 PS 2 18 (SUTURE) ×2 IMPLANT
SUT MERSILENE 4 0 P 3 (SUTURE) ×1 IMPLANT
SYR BULB EAR ULCER 3OZ GRN STR (SYRINGE) ×2 IMPLANT
SYR CONTROL 10ML LL (SYRINGE) ×2 IMPLANT
TOWEL GREEN STERILE FF (TOWEL DISPOSABLE) ×4 IMPLANT
UNDERPAD 30X36 HEAVY ABSORB (UNDERPADS AND DIAPERS) ×2 IMPLANT

## 2022-12-26 NOTE — Discharge Instructions (Addendum)
[facility] Instructions Hand Surgery  Wound Care: Keep your hand elevated above the level of your heart.  Do not allow it to dangle by your side.  Keep the dressing dry and do not remove it unless your doctor advises you to do so.  He will usually change it at the time of your post-op visit.  Moving your fingers is advised to stimulate circulation but will depend on the site of your surgery.  If you have a splint applied, your doctor will advise you regarding movement.  Activity: Do not drive or operate machinery today.  Rest today and then you may return to your normal activity and work as indicated by your physician.  Diet:  Drink liquids today or eat a light diet.  You may resume a regular diet tomorrow.    General expectations: Pain for two to three days. Fingers may become slightly swollen.  Call your doctor if any of the following occur: Severe pain not relieved by pain medication. Elevated temperature. Dressing soaked with blood. Inability to move fingers. White or bluish color to fingers.   Next dose of Tyenol due after 5:20pm tonight, if needed.   Post Anesthesia Home Care Instructions  Activity: Get plenty of rest for the remainder of the day. A responsible individual must stay with you for 24 hours following the procedure.  For the next 24 hours, DO NOT: -Drive a car -Advertising copywriter -Drink alcoholic beverages -Take any medication unless instructed by your physician -Make any legal decisions or sign important papers.  Meals: Start with liquid foods such as gelatin or soup. Progress to regular foods as tolerated. Avoid greasy, spicy, heavy foods. If nausea and/or vomiting occur, drink only clear liquids until the nausea and/or vomiting subsides. Call your physician if vomiting continues.  Special Instructions/Symptoms: Your throat may feel dry or sore from the anesthesia or the breathing tube placed in your throat during surgery. If this causes discomfort, gargle  with warm salt water. The discomfort should disappear within 24 hours.  If you had a scopolamine patch placed behind your ear for the management of post- operative nausea and/or vomiting:  1. The medication in the patch is effective for 72 hours, after which it should be removed.  Wrap patch in a tissue and discard in the trash. Wash hands thoroughly with soap and water. 2. You may remove the patch earlier than 72 hours if you experience unpleasant side effects which may include dry mouth, dizziness or visual disturbances. 3. Avoid touching the patch. Wash your hands with soap and water after contact with the patch.

## 2022-12-26 NOTE — Anesthesia Preprocedure Evaluation (Addendum)
Anesthesia Evaluation  Patient identified by MRN, date of birth, ID band Patient awake    Reviewed: Allergy & Precautions, NPO status , Patient's Chart, lab work & pertinent test results  History of Anesthesia Complications Negative for: history of anesthetic complications  Airway Mallampati: II  TM Distance: >3 FB Neck ROM: Full    Dental no notable dental hx.    Pulmonary sleep apnea and Continuous Positive Airway Pressure Ventilation , Current Smoker   Pulmonary exam normal        Cardiovascular hypertension, Pt. on medications Normal cardiovascular exam     Neuro/Psych   Anxiety Depression       GI/Hepatic Neg liver ROS,GERD  ,,  Endo/Other  negative endocrine ROS    Renal/GU negative Renal ROS     Musculoskeletal   Abdominal   Peds  Hematology negative hematology ROS (+)   Anesthesia Other Findings CYSTIC MASS DORSUM LEFT RING FINGER PROXIMAL INTERPHALANGEAL JOINT  Reproductive/Obstetrics                              Anesthesia Physical Anesthesia Plan  ASA: 2  Anesthesia Plan: Bier Block and Bier Block-Lidocaine Only   Post-op Pain Management: Tylenol PO (pre-op)*   Induction:   PONV Risk Score and Plan: 0 and Treatment may vary due to age or medical condition, Midazolam, Ondansetron and Propofol infusion  Airway Management Planned: Natural Airway and Simple Face Mask  Additional Equipment: None  Intra-op Plan:   Post-operative Plan:   Informed Consent: I have reviewed the patients History and Physical, chart, labs and discussed the procedure including the risks, benefits and alternatives for the proposed anesthesia with the patient or authorized representative who has indicated his/her understanding and acceptance.       Plan Discussed with: CRNA  Anesthesia Plan Comments:         Anesthesia Quick Evaluation

## 2022-12-26 NOTE — H&P (Signed)
Benjamin Lloyd is an 67 y.o. male.   Chief Complaint: finger cyst HPI: 67 yo male with left ring finger cyst at pip joint.  It is bothersome to him.  He wishes to have it removed and the pip joint possibly debrided and rotation flap as necessary.  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   Anxiety    BPH (benign prostatic hyperplasia)    Depression    GERD (gastroesophageal reflux disease)    Hypertension    Metatarsal fracture    Sleep apnea 09/05/2010   mild-mod-does use a cpap-will bring dos   Thalassemia minor    no issues    Past Surgical History:  Procedure Laterality Date   APPENDECTOMY  09/05/1968   COLONOSCOPY  2017   FINGER EXPLORATION  09/05/2008   mass lt finger   FOOT ARTHROTOMY     left   SHOULDER ARTHROSCOPY WITH DISTAL CLAVICLE RESECTION  07/17/2012   Procedure: SHOULDER ARTHROSCOPY WITH DISTAL CLAVICLE RESECTION;  Surgeon: Wyn Forster., MD;  Location: [facility];  Service: Orthopedics;  Laterality: Right;  Right Shoulder Arthroscopy; Superior Labrum Anterior-Posterior Debridement / Repair; Subacromial Decompression and Distal Clavicle Resection   SHOULDER ARTHROSCOPY WITH SUBACROMIAL DECOMPRESSION  07/17/2012   Procedure: SHOULDER ARTHROSCOPY WITH SUBACROMIAL DECOMPRESSION;  Surgeon: Wyn Forster., MD;  Location: [facility];  Service: Orthopedics;  Laterality: Right;  Right Shoulder Arthroscopy; Superior Labrum Anterior-Posterior Debridement / Repair; Subacromial Decompression and Distal Clavicle Resection   UPPER GI ENDOSCOPY      Family History: Family History  Problem Relation Age of Onset   Hyperlipidemia Mother    Heart attack Mother    Colon cancer Mother 71   Heart disease Mother    Stroke Father    Hypertension Father    Heart  disease Father    Hyperlipidemia Brother     Social History:   reports that he has been smoking cigars. He has never used smokeless tobacco. He reports current alcohol use of about 5.0 standard drinks of alcohol per week. He reports that he does not use drugs.  Medications: Medications Prior to Admission  Medication Sig Dispense Refill   ALPRAZolam (XANAX) 0.5 MG tablet Take by mouth.     ascorbic acid (VITAMIN C) 500 MG tablet Take 500 mg by mouth daily.     busPIRone (BUSPAR) 10 MG tablet Take 10 mg by mouth daily.     co-enzyme Q-10 30 MG capsule Take 30 mg by mouth 3 (three) times daily.     escitalopram (LEXAPRO) 10 MG tablet Take 10 mg by mouth daily.     fenofibrate (TRICOR) 145 MG tablet Take 145 mg by mouth daily.     ibuprofen (ADVIL,MOTRIN) 800 MG tablet Take 1 tablet (800 mg total) by mouth 3 (three) times daily. 21 tablet 0   losartan (COZAAR) 50 MG tablet Take by mouth.     omeprazole (PRILOSEC) 40 MG capsule Take 40 mg by mouth daily.       PARoxetine (PAXIL) 20 MG tablet  See Instructions, TAKE 1 TABLET BY MOUTH EVERY DAY, # 90 tab, 1 Refill(s), Pharmacy: [id number]     rosuvastatin (CRESTOR) 20 MG tablet Take 1 tablet (20 mg total) by  mouth daily. 90 tablet 2   Turmeric 500 MG CAPS Take by mouth.     Vitamin D, Ergocalciferol, (DRISDOL) 1.25 MG (50000 UNIT) CAPS capsule Take 50,000 Units by mouth every 7 (seven) days.     triamcinolone cream (KENALOG) 0.1 %   0    No results found for this or any previous visit (from the past 48 hour(s)).  No results found.    Blood pressure 135/76, pulse 61, temperature 98.2 F (36.8 C), temperature source Oral, resp. rate 18, height  (1.778 m), weight 85.5 kg, SpO2 99 %.  General appearance: alert, cooperative, and appears stated age Head: Normocephalic, without obvious abnormality, atraumatic Neck: supple, symmetrical, trachea midline Extremities: Intact sensation and capillary refill all digits.  +epl/fpl/io.   No wounds.  Pulses: 2+ and symmetric Skin: Skin color, texture, turgor normal. No rashes or lesions Neurologic: Grossly normal Incision/Wound: none  Assessment/Plan Left ring finger cyst.  Plan excision cyst with possible pip joint debridement and possible rotation flap as necessary.  Non operative and operative treatment options have been discussed with the patient and patient wishes to proceed with operative treatment. Risks, benefits, and alternatives of surgery have been discussed and the patient agrees with the plan of care.   Betha Loa 12/26/2022, 11:56 AM

## 2022-12-26 NOTE — Transfer of Care (Signed)
Immediate Anesthesia Transfer of Care Note  Patient: [name]  Procedure(s) Performed: EXCISION MASS DORSUM LEFT RING FINGER PROXIMAL INTERPHALANGEAL JOINT (Left: Finger)  Patient Location: PACU  Anesthesia Type:MAC  Level of Consciousness: awake and patient cooperative  Airway & Oxygen Therapy: spontaneously breathing, room air Post-op Assessment: Report given to RN and Post -op Vital signs reviewed and stable  Post vital signs: Reviewed and stable  Last Vitals:  Vitals Value Taken Time  BP 126/83 12/26/22 1300  Temp    Pulse 62 12/26/22 1300  Resp 13 12/26/22 1300  SpO2 93 % 12/26/22 1300  Vitals shown include unvalidated device data.  Last Pain:  Vitals:   12/26/22 1120  TempSrc: Oral  PainSc: 0-No pain      Patients Stated Pain Goal: 4 (12/26/22 1120)  Complications: No notable events documented.

## 2022-12-26 NOTE — Anesthesia Postprocedure Evaluation (Signed)
Anesthesia Post Note  Patient: Benjamin Lloyd  Procedure(s) Performed: EXCISION MASS DORSUM LEFT RING FINGER PROXIMAL INTERPHALANGEAL JOINT (Left: Finger)     Patient location during evaluation: PACU Anesthesia Type: Bier Block Level of consciousness: awake and alert Pain management: pain level controlled Vital Signs Assessment: post-procedure vital signs reviewed and stable Respiratory status: spontaneous breathing, nonlabored ventilation and respiratory function stable Cardiovascular status: blood pressure returned to baseline Postop Assessment: no apparent nausea or vomiting Anesthetic complications: no   No notable events documented.  Last Vitals:  Vitals:   12/26/22 1300 12/26/22 1315  BP: 126/83 138/82  Pulse: 62 61  Resp: 13 13  Temp: (!) 36.3 C   SpO2: 93% 93%    Last Pain:  Vitals:   12/26/22 1315  TempSrc:   PainSc: 0-No pain                 Shanda Howells

## 2022-12-26 NOTE — Anesthesia Procedure Notes (Signed)
Procedure Name: MAC Date/Time: 12/26/2022 12:17 PM  Performed by: Demetrio Lapping, CRNAPre-anesthesia Checklist: Patient identified, Emergency Drugs available, Suction available, Patient being monitored and Timeout performed Patient Re-evaluated:Patient Re-evaluated prior to induction Oxygen Delivery Method: Simple face mask Placement Confirmation: positive ETCO2 Dental Injury: Teeth and Oropharynx as per pre-operative assessment

## 2022-12-26 NOTE — Op Note (Signed)
NAME: Benjamin Lloyd MEDICAL RECORD NO: [id number] DATE OF BIRTH: 02-11-1956 FACILITY: [facility] LOCATION: [facility] PHYSICIAN: Tami Ribas, MD   OPERATIVE REPORT   DATE OF PROCEDURE: 12/26/22    PREOPERATIVE DIAGNOSIS: Left ring finger cyst   POSTOPERATIVE DIAGNOSIS: Left ring finger cyst   PROCEDURE: 1.  Left ring finger excision of ganglion cyst including attached skin, approximately 8 mm 2.  Left ring finger debridement of PIP joint and closure of extensor tendon rent   SURGEON:  Betha Loa, M.D.   ASSISTANT: none   ANESTHESIA:  Bier block with sedation   INTRAVENOUS FLUIDS:  Per anesthesia flow sheet.   ESTIMATED BLOOD LOSS:  Minimal.   COMPLICATIONS:  None.   SPECIMENS: Left ring finger cyst to pathology   TOURNIQUET TIME:    Total Tourniquet Time Documented: Forearm (Left) - 29 minutes Total: Forearm (Left) - 29 minutes    DISPOSITION:  Stable to PACU.   INDICATIONS: 67 year old male with cyst on left ring finger.  It is bothersome to him.  He wishes to have it removed and the PIP joint debrided as necessary with possible rotation flap.  Risks, benefits and alternatives of surgery were discussed including the risks of blood loss, infection, damage to nerves, vessels, tendons, ligaments, bone for surgery, need for additional surgery, complications with wound healing, continued pain, stiffness, , recurrence.  He voiced understanding of these risks and elected to proceed.  OPERATIVE COURSE:  After being identified preoperatively by myself,  the patient and I agreed on the procedure and site of the procedure.  The surgical site was marked.  Surgical consent had been signed. Preoperative IV antibiotic prophylaxis was given. He was transferred to the operating room and placed on the operating table in supine position with the Left upper extremity on an arm board.  Bier block anesthesia was induced by the anesthesiologist.  Left upper extremity was  prepped and draped in normal sterile orthopedic fashion.  A surgical pause was performed between the surgeons, anesthesia, and operating room staff and all were in agreement as to the patient, procedure, and site of procedure.  Tourniquet at the proximal aspect of the forearm had been inflated for the Bier block.  Incision was made adjacent to the cyst on the ulnar side.  This was carried in subcutaneous tissues by spreading technique.  The cyst was opened.  It was filled with clear gelatinous fluid.  It was adherent to the undersurface of the skin.  The skin was thinned.  The cyst was removed including the attached skin.  Approximately 8 mm in diameter.  There was a rent in the extensor tendon more toward the ulnar side.  This went down to the PIP joint.  The synovectomy rongeurs were used to perform a synovectomy and remove any degenerative looking tissue to try to prevent recurrence.  The wound and joint were copiously irrigated with sterile saline.  4-0 Mersilene suture was used in a figure-of-eight fashion to close the rent in the extensor tendon.  The skin was able to be closed with 4-0 nylon in a horizontal mattress fashion.  A digital block of been performed with quarter percent plain Marcaine at the beginning of the case.  The wound was dressed with sterile Xeroform 4 x 4 and wrapped with a Coban dressing lightly.  An AlumaFoam splint was placed and wrapped lightly with Coban dressing.  The tourniquet was deflated at 29 minutes.  Fingertips were pink with brisk capillary refill after  deflation of tourniquet.  The operative  drapes were broken down.  The patient was awoken from anesthesia safely.  He was transferred back to the stretcher and taken to PACU in stable condition.  I will see him back in the office in 1 week for postoperative followup.  I will give him a prescription for Norco 5/325 1-2 tabs PO q6 hours prn pain, dispense # 15.   Betha Loa, MD Electronically signed, 12/26/22

## 2022-12-27 ENCOUNTER — Other Ambulatory Visit: Payer: Self-pay | Admitting: Cardiology

## 2022-12-27 ENCOUNTER — Encounter (HOSPITAL_BASED_OUTPATIENT_CLINIC_OR_DEPARTMENT_OTHER): Payer: Self-pay | Admitting: Orthopedic Surgery

## 2022-12-27 DIAGNOSIS — E782 Mixed hyperlipidemia: Secondary | ICD-10-CM

## 2022-12-27 DIAGNOSIS — Z79899 Other long term (current) drug therapy: Secondary | ICD-10-CM

## 2022-12-27 LAB — SURGICAL PATHOLOGY

## 2023-06-12 IMAGING — CT CT HEART MORP W/ CTA COR W/ SCORE W/ CA W/CM &/OR W/O CM
4 of 7 series · 8 of 20 positions shown, 9 images · non-contrast
Comparison: None.
COMPARISON: None.

Addendum:
EXAM:
OVER-READ INTERPRETATION  CT CHEST

The following report is an over-read performed by radiologist Dr.
Mouton Fago [REDACTED] on 10/04/2021. This over-read
does not include interpretation of cardiac or coronary anatomy or
pathology. The coronary calcium score/coronary CTA interpretation by
the cardiologist is attached.
CLINICAL DATA: Chest pain
Cardiac/Coronary CTA
TECHNIQUE: A non-contrast, gated CT scan was obtained with axial slices of 3 mm
through the heart for calcium scoring. Calcium scoring was performed
using the Agatston method. A 120 kV prospective, gated, contrast
cardiac scan was obtained. Gantry rotation speed was 250 msecs and
collimation was 0.6 mm. Two sublingual nitroglycerin tablets (0.8
mg) were given. The 3D data set was reconstructed in 5% intervals of
the 35-75% of the R-R cycle. Diastolic phases were analyzed on a
dedicated workstation using MPR, MIP, and VRT modes. The patient
received 95 cc of contrast.

[Series 6: best diast · axial · 0.39mm/px · z∈[+1217,+1254]mm · 2 of 278 slices shown, 3 images]
[im 93/278  vessel]
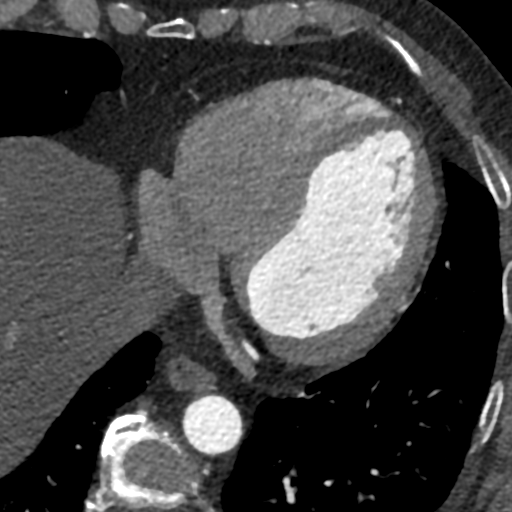
[im 93/278  lung]
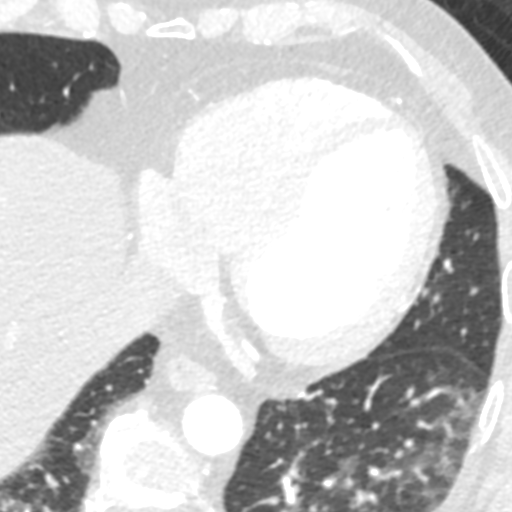
[im 185/278  vessel]
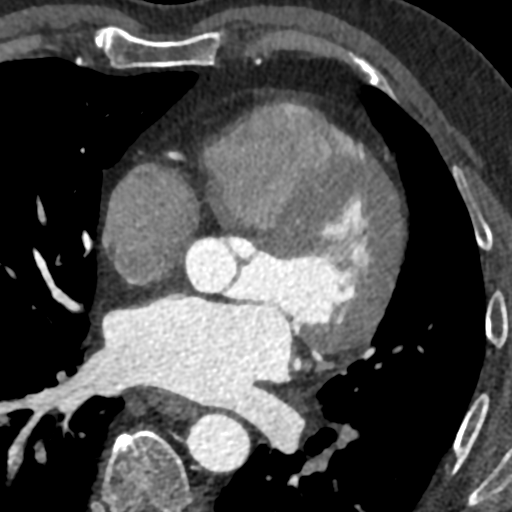

[Series 7: best syst · axial · 0.39mm/px · z∈[+1217,+1254]mm · 2 of 278 slices shown]
[im 93/278  vessel]
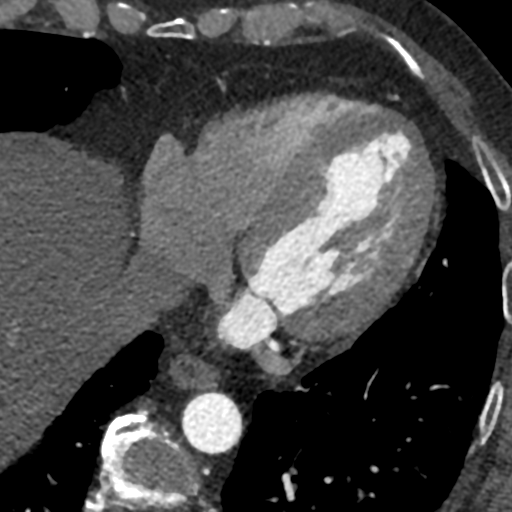
[im 185/278  vessel]
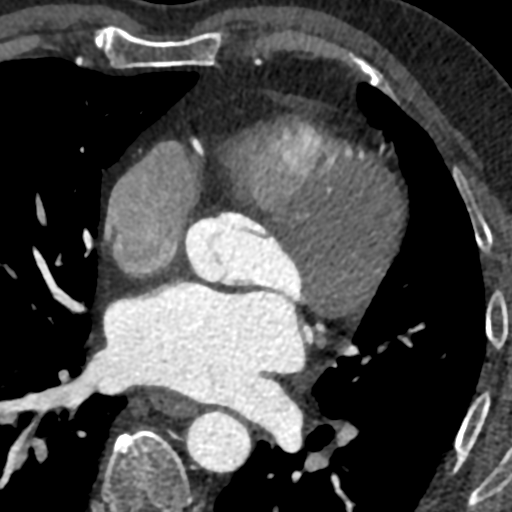

[Series 8: ts diast sharp · axial · 0.39mm/px · z∈[+1217,+1254]mm · 2 of 278 slices shown]
[im 93/278  lung]
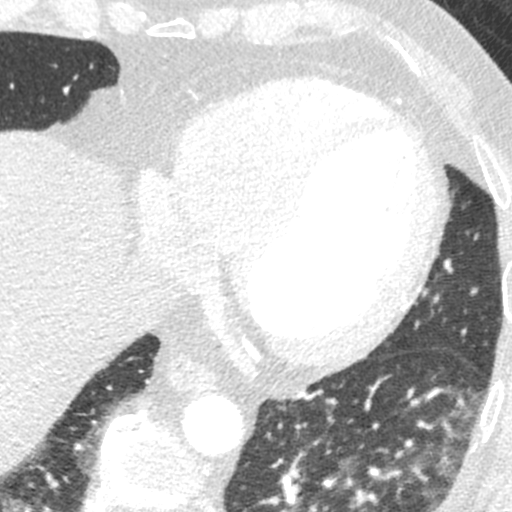
[im 185/278  lung]
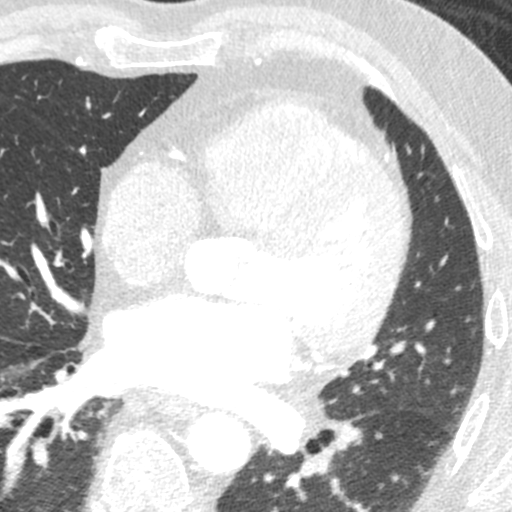

[Series 9: ts syst sharp · axial · 0.39mm/px · z∈[+1217,+1254]mm · 2 of 277 slices shown]
[im 93/277  lung]
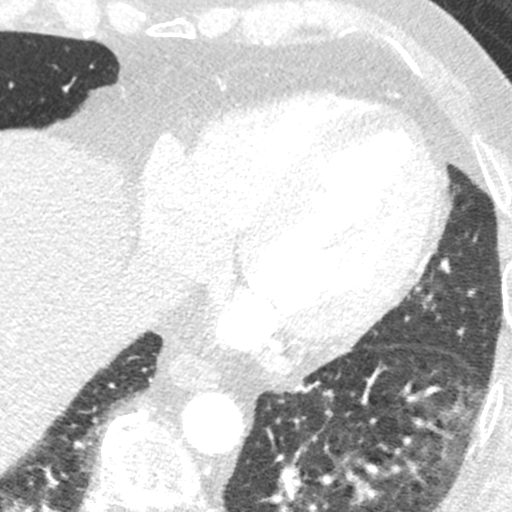
[im 185/277  lung]
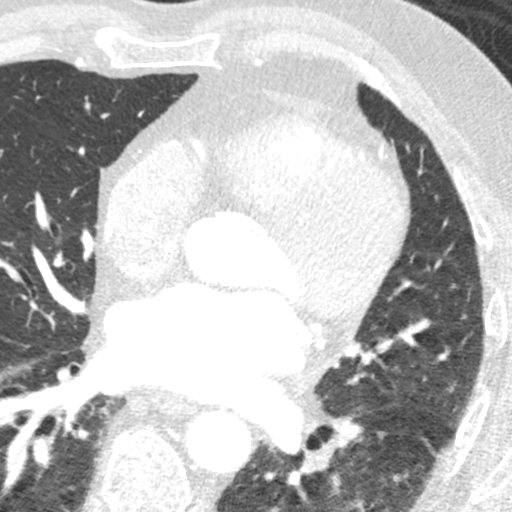

[8 of 20 positions shown; findings below may reference images not displayed]

FINDINGS: Vascular: Normal caliber of the visualized thoracic aorta. Main
pulmonary arteries are incompletely evaluated but no large filling
defects.

Mediastinum/Nodes: Visualized mediastinal structures are normal.

Lungs/Pleura: Dependent densities in the lower lungs are most
compatible with atelectasis. Otherwise, the visualized lungs are
clear without large pleural effusions.

Upper Abdomen: Low-density in the liver that is suspicious for
steatosis.

Musculoskeletal: Bridging osteophytes in the thoracic spine.
IMPRESSION: 1. No acute extracardiac findings.
2. Visualized liver has low density. Findings can be associated with
steatosis.
FINDINGS: Image quality: Excellent.

Noise artifact is: Limited.

Coronary Arteries:  Normal coronary origin.  Left dominance.

Left main: The left main is a large caliber vessel with a normal
take off from the left coronary cusp that bifurcates into a LAD and
LCX. There is minimal calcified plaque in the osital LM with
associated stenosis of < 25%.

Left anterior descending artery: The LAD gives off 3 patent diagonal
branches. There is minimal calcified plaque in the proximal LAD with
associated stenosis of < 25%.

Left circumflex artery: The LCX is a dominant and patent with no
evidence of plaque or stenosis. The LCX gives off 2 patent obtuse
marginal branches, LPLA and PDA branches.

Right coronary artery: The RCA is a non-dominant with normal take
off from the right coronary cusp. There is no evidence of plaque or
stenosis.

Right Atrium: Right atrial size is within normal limits.

Right Ventricle: The right ventricular cavity is within normal
limits.

Left Atrium: Left atrial size is normal in size with no left atrial
appendage filling defect.

Left Ventricle: The ventricular cavity size is within normal limits.
There are no stigmata of prior infarction. There is no abnormal
filling defect.

Pulmonary arteries: Normal in size without proximal filling defect.

Pulmonary veins: Normal pulmonary venous drainage.

Pericardium: Normal thickness with no significant effusion or
calcium present.

Cardiac valves: The aortic valve is trileaflet without significant
calcification. The mitral valve is normal structure without
significant calcification.

Aorta: Normal caliber with no significant disease.

Extra-cardiac findings: See attached radiology report for
non-cardiac structures.
IMPRESSION: 1. Coronary calcium score of 8.54. This was 25th percentile for
age-, sex, and race-matched controls.

2. Normal coronary origin with right dominance.

3.  Minimal atherosclerosis.

RECOMMENDATIONS:
1. CAD-RADS 0: No evidence of CAD (0%). Consider non-atherosclerotic
causes of chest pain.

2. CAD-RADS 1: Minimal non-obstructive CAD (0-24%). Consider
non-atherosclerotic causes of chest pain. Consider preventive
therapy and risk factor modification.

3. CAD-RADS 2: Mild non-obstructive CAD (25-49%). Consider
non-atherosclerotic causes of chest pain. Consider preventive
therapy and risk factor modification.

4. CAD-RADS 3: Moderate stenosis. Consider symptom-guided
anti-ischemic pharmacotherapy as well as risk factor modification
per guideline directed care. Additional analysis with CT FFR will be
submitted.

5. CAD-RADS 4: Severe stenosis. (70-99% or > 50% left main). Cardiac
catheterization or CT FFR is recommended. Consider symptom-guided
anti-ischemic pharmacotherapy as well as risk factor modification
per guideline directed care. Invasive coronary angiography
recommended with revascularization per published guideline
statements.

6. CAD-RADS 5: Total coronary occlusion (100%). Consider cardiac
catheterization or viability assessment. Consider symptom-guided
anti-ischemic pharmacotherapy as well as risk factor modification
per guideline directed care.

7. CAD-RADS N: Non-diagnostic study. Obstructive CAD can't be
excluded. Alternative evaluation is recommended.

*** End of Addendum ***
EXAM:
OVER-READ INTERPRETATION  CT CHEST

The following report is an over-read performed by radiologist Dr.
Mouton Fago [REDACTED] on 10/04/2021. This over-read
does not include interpretation of cardiac or coronary anatomy or
pathology. The coronary calcium score/coronary CTA interpretation by
the cardiologist is attached.
FINDINGS: Vascular: Normal caliber of the visualized thoracic aorta. Main
pulmonary arteries are incompletely evaluated but no large filling
defects.

Mediastinum/Nodes: Visualized mediastinal structures are normal.

Lungs/Pleura: Dependent densities in the lower lungs are most
compatible with atelectasis. Otherwise, the visualized lungs are
clear without large pleural effusions.

Upper Abdomen: Low-density in the liver that is suspicious for
steatosis.

Musculoskeletal: Bridging osteophytes in the thoracic spine.
IMPRESSION: 1. No acute extracardiac findings.
2. Visualized liver has low density. Findings can be associated with
steatosis.

## 2024-05-10 ENCOUNTER — Ambulatory Visit: Attending: Nurse Practitioner | Admitting: Nurse Practitioner

## 2024-05-10 ENCOUNTER — Encounter: Payer: Self-pay | Admitting: Nurse Practitioner

## 2024-05-10 VITALS — BP 132/86 | HR 66 | Ht 70.0 in | Wt 179.0 lb

## 2024-05-10 DIAGNOSIS — R0609 Other forms of dyspnea: Secondary | ICD-10-CM | POA: Diagnosis present

## 2024-05-10 DIAGNOSIS — I10 Essential (primary) hypertension: Secondary | ICD-10-CM | POA: Insufficient documentation

## 2024-05-10 DIAGNOSIS — G4733 Obstructive sleep apnea (adult) (pediatric): Secondary | ICD-10-CM | POA: Diagnosis present

## 2024-05-10 DIAGNOSIS — E785 Hyperlipidemia, unspecified: Secondary | ICD-10-CM | POA: Insufficient documentation

## 2024-05-10 DIAGNOSIS — I2510 Atherosclerotic heart disease of native coronary artery without angina pectoris: Secondary | ICD-10-CM | POA: Diagnosis present

## 2024-05-10 NOTE — Patient Instructions (Signed)
 Medication Instructions:  Your physician recommends that you continue on your current medications as directed. Please refer to the Current Medication list given to you today.  *If you need a refill on your cardiac medications before your next appointment, please call your pharmacy*  Lab Work: NONE ordered at this time of appointment   Testing/Procedures: NONE ordered at this time of appointment   Follow-Up: At [facility], you and your health needs are our priority.  As part of our continuing mission to provide you with exceptional heart care, our providers are all part of one team.  This team includes your primary Cardiologist (physician) and Advanced Practice Providers or APPs (Physician Assistants and Nurse Practitioners) who all work together to provide you with the care you need, when you need it.  Your next appointment:   1 year(s)  Provider:   Oneil Parchment, MD    We recommend signing up for the patient portal called MyChart.  Sign up information is provided on this After Visit Summary.  MyChart is used to connect with patients for Virtual Visits (Telemedicine).  Patients are able to view lab/test results, encounter notes, upcoming appointments, etc.  Non-urgent messages can be sent to your provider as well.   To learn more about what you can do with MyChart, go to [email or web address].   Other Instructions Monitor Blood pressure. Report BP consistently greater than 130/80.

## 2024-05-10 NOTE — Progress Notes (Signed)
 Office Visit    Patient Name: Benjamin Lloyd Date of Encounter: 05/10/2024  Primary Care Provider:  Toy Laurance POUR, MD Primary Cardiologist:  Oneil Parchment, MD  Chief Complaint    68 year old male with a history of abnormal EKG, coronary artery calcification, dyspnea on exertion, hypertension, hyperlipidemia, thalassemia minor, BPH, OSA, GERD, former tobacco use, anxiety, and depression who presents for follow-up related to dyspnea on exertion.  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   Anxiety    BPH (benign prostatic hyperplasia)    Depression    GERD (gastroesophageal reflux disease)    Hypertension    Metatarsal fracture    Sleep apnea 09/05/2010   mild-mod-does use a cpap-will bring dos   Thalassemia minor    no issues   Past Surgical History:  Procedure Laterality Date   APPENDECTOMY  09/05/1968   COLONOSCOPY  2017   EXCISION METACARPAL MASS Left 12/26/2022   Procedure: EXCISION MASS DORSUM LEFT RING FINGER PROXIMAL INTERPHALANGEAL JOINT;  Surgeon: Murrell Drivers, MD;  Location: [facility];  Service: Orthopedics;  Laterality: Left;   FINGER EXPLORATION  09/05/2008   mass lt finger   FOOT ARTHROTOMY     left   SHOULDER ARTHROSCOPY WITH DISTAL CLAVICLE RESECTION  07/17/2012   Procedure: SHOULDER ARTHROSCOPY WITH DISTAL CLAVICLE RESECTION;  Surgeon: Lamar LULLA Leonor Mickey., MD;  Location: [facility];  Service: Orthopedics;  Laterality: Right;  Right Shoulder Arthroscopy; Superior Labrum Anterior-Posterior Debridement / Repair; Subacromial Decompression and Distal Clavicle Resection   SHOULDER ARTHROSCOPY WITH SUBACROMIAL DECOMPRESSION  07/17/2012   Procedure: SHOULDER ARTHROSCOPY WITH SUBACROMIAL DECOMPRESSION;  Surgeon: Lamar LULLA Leonor Mickey., MD;  Location: [facility];  Service: Orthopedics;  Laterality: Right;  Right Shoulder Arthroscopy; Superior Labrum  Anterior-Posterior Debridement / Repair; Subacromial Decompression and Distal Clavicle Resection   UPPER GI ENDOSCOPY      Allergies  Allergies  Allergen Reactions   Aspirin    Other     Blood thinners   Plavix [Clopidogrel]    Sulfa Antibiotics      Labs/Other Studies Reviewed    The following studies were reviewed today:  Cardiac Studies & Procedures   ______________________________________________________________________________________________     ECHOCARDIOGRAM  ECHOCARDIOGRAM COMPLETE 10/04/2021  Narrative ECHOCARDIOGRAM REPORT    Patient Name:   Benjamin Lloyd Date of Exam: 10/04/2021 Medical Rec #:  [id number]        Height:       70.0 in Accession #:    [id number]       Weight:       192.0 lb Date of Birth:  [birth year]       BSA:          2.051 m Patient Age:    65 years         BP:           134/70 mmHg Patient Gender: M                HR:           67 bpm. Exam Location:  [address]  Procedure: 2D Echo, Cardiac Doppler and Color Doppler  Indications:    R94.31 Abnormal EKG  History:        Patient has no prior history of Echocardiogram examinations. Abnormal ECG, Signs/Symptoms:Shortness of Breath and Chest Pain; Risk Factors:Hypertension, Dyslipidemia and Former Smoker.  Sonographer:  Elsie Bohr RDCS Referring Phys: [id number] MARK C SKAINS  IMPRESSIONS   1. Left ventricular ejection fraction, by estimation, is 60 to 65%. The left ventricle has normal function. The left ventricle has no regional wall motion abnormalities. There is mild concentric left ventricular hypertrophy. Left ventricular diastolic parameters are consistent with Grade I diastolic dysfunction (impaired relaxation). 2. Right ventricular systolic function is normal. The right ventricular size is mildly enlarged. There is normal pulmonary artery systolic pressure. 3. Left atrial size was mildly dilated. 4. The mitral valve is abnormal- mild anterior valve prolapse. No  evidence of mitral valve regurgitation. No evidence of mitral stenosis. 5. The aortic valve is tricuspid. Aortic valve regurgitation is not visualized. No aortic stenosis is present. 6. The inferior vena cava is normal in size with greater than 50% respiratory variability, suggesting right atrial pressure of 3 mmHg.  Comparison(s): No prior Echocardiogram.  FINDINGS Left Ventricle: Left ventricular ejection fraction, by estimation, is 60 to 65%. The left ventricle has normal function. The left ventricle has no regional wall motion abnormalities. The left ventricular internal cavity size was small. There is mild concentric left ventricular hypertrophy. Left ventricular diastolic parameters are consistent with Grade I diastolic dysfunction (impaired relaxation).  Right Ventricle: The right ventricular size is mildly enlarged. No increase in right ventricular wall thickness. Right ventricular systolic function is normal. There is normal pulmonary artery systolic pressure. The tricuspid regurgitant velocity is 2.45 m/s, and with an assumed right atrial pressure of 3 mmHg, the estimated right ventricular systolic pressure is 27.0 mmHg.  Left Atrium: Left atrial size was mildly dilated.  Right Atrium: Right atrial size was normal in size.  Pericardium: There is no evidence of pericardial effusion.  Mitral Valve: The mitral valve is abnormal. No evidence of mitral valve regurgitation. No evidence of mitral valve stenosis.  Tricuspid Valve: The tricuspid valve is normal in structure. Tricuspid valve regurgitation is mild . No evidence of tricuspid stenosis.  Aortic Valve: The aortic valve is tricuspid. Aortic valve regurgitation is not visualized. No aortic stenosis is present.  Pulmonic Valve: The pulmonic valve was not well visualized. Pulmonic valve regurgitation is not visualized. No evidence of pulmonic stenosis.  Aorta: The aortic root and ascending aorta are structurally normal, with no  evidence of dilitation.  Venous: The inferior vena cava is normal in size with greater than 50% respiratory variability, suggesting right atrial pressure of 3 mmHg.  IAS/Shunts: No atrial level shunt detected by color flow Doppler.   LEFT VENTRICLE PLAX 2D LVIDd:         4.90 cm   Diastology LVIDs:         3.10 cm   LV e' medial:    9.68 cm/s LV PW:         1.10 cm   LV E/e' medial:  9.5 LV IVS:        1.10 cm   LV e' lateral:   10.40 cm/s LVOT diam:     2.00 cm   LV E/e' lateral: 8.9 LV SV:         75 LV SV Index:   36 LVOT Area:     3.14 cm   RIGHT VENTRICLE             IVC RV S prime:     11.90 cm/s  IVC diam: 1.10 cm TAPSE (M-mode): 1.5 cm RVSP:           27.0 mmHg  LEFT ATRIUM  Index        RIGHT ATRIUM           Index LA diam:        4.70 cm 2.29 cm/m   RA Pressure: 3.00 mmHg LA Vol (A2C):   72.3 ml 35.24 ml/m  RA Area:     12.90 cm LA Vol (A4C):   75.4 ml 36.75 ml/m  RA Volume:   30.40 ml  14.82 ml/m LA Biplane Vol: 73.9 ml 36.02 ml/m AORTIC VALVE LVOT Vmax:   115.00 cm/s LVOT Vmean:  74.700 cm/s LVOT VTI:    0.238 m  AORTA Ao Root diam: 3.50 cm Ao Asc diam:  3.30 cm  MITRAL VALVE                TRICUSPID VALVE MV Area (PHT): 3.03 cm     TR Peak grad:   24.0 mmHg MV Decel Time: 250 msec     TR Vmax:        245.00 cm/s MV E velocity: 92.20 cm/s   Estimated RAP:  3.00 mmHg MV A velocity: 102.00 cm/s  RVSP:           27.0 mmHg MV E/A ratio:  0.90 SHUNTS Systemic VTI:  0.24 m Systemic Diam: 2.00 cm  Stanly Leavens MD Electronically signed by Stanly Leavens MD Signature Date/Time: 10/04/2021/11:59:50 AM    Final      CT SCANS  CT CORONARY MORPH W/CTA COR W/SCORE 10/04/2021  Addendum 10/04/2021  9:49 AM ADDENDUM REPORT: 10/04/2021 09:47  CLINICAL DATA:  Chest pain  EXAM: Cardiac/Coronary CTA  TECHNIQUE: A non-contrast, gated CT scan was obtained with axial slices of 3 mm through the heart for calcium  scoring. Calcium   scoring was performed using the Agatston method. A 120 kV prospective, gated, contrast cardiac scan was obtained. Gantry rotation speed was 250 msecs and collimation was 0.6 mm. Two sublingual nitroglycerin  tablets (0.8 mg) were given. The 3D data set was reconstructed in 5% intervals of the 35-75% of the R-R cycle. Diastolic phases were analyzed on a dedicated workstation using MPR, MIP, and VRT modes. The patient received 95 cc of contrast.  FINDINGS: Image quality: Excellent.  Noise artifact is: Limited.  Coronary Arteries:  Normal coronary origin.  Left dominance.  Left main: The left main is a large caliber vessel with a normal take off from the left coronary cusp that bifurcates into a LAD and LCX. There is minimal calcified plaque in the osital LM with associated stenosis of < 25%.  Left anterior descending artery: The LAD gives off 3 patent diagonal branches. There is minimal calcified plaque in the proximal LAD with associated stenosis of < 25%.  Left circumflex artery: The LCX is a dominant and patent with no evidence of plaque or stenosis. The LCX gives off 2 patent obtuse marginal branches, LPLA and PDA branches.  Right coronary artery: The RCA is a non-dominant with normal take off from the right coronary cusp. There is no evidence of plaque or stenosis.  Right Atrium: Right atrial size is within normal limits.  Right Ventricle: The right ventricular cavity is within normal limits.  Left Atrium: Left atrial size is normal in size with no left atrial appendage filling defect.  Left Ventricle: The ventricular cavity size is within normal limits. There are no stigmata of prior infarction. There is no abnormal filling defect.  Pulmonary arteries: Normal in size without proximal filling defect.  Pulmonary veins: Normal pulmonary venous drainage.  Pericardium: Normal thickness with  no significant effusion or calcium  present.  Cardiac valves: The aortic valve  is trileaflet without significant calcification. The mitral valve is normal structure without significant calcification.  Aorta: Normal caliber with no significant disease.  Extra-cardiac findings: See attached radiology report for non-cardiac structures.  IMPRESSION: 1. Coronary calcium  score of 8.54. This was 25th percentile for age-, sex, and race-matched controls.  2. Normal coronary origin with right dominance.  3.  Minimal atherosclerosis.  RECOMMENDATIONS: 1. CAD-RADS 0: No evidence of CAD (0%). Consider non-atherosclerotic causes of chest pain.  2. CAD-RADS 1: Minimal non-obstructive CAD (0-24%). Consider non-atherosclerotic causes of chest pain. Consider preventive therapy and risk factor modification.  3. CAD-RADS 2: Mild non-obstructive CAD (25-49%). Consider non-atherosclerotic causes of chest pain. Consider preventive therapy and risk factor modification.  4. CAD-RADS 3: Moderate stenosis. Consider symptom-guided anti-ischemic pharmacotherapy as well as risk factor modification per guideline directed care. Additional analysis with CT FFR will be submitted.  5. CAD-RADS 4: Severe stenosis. (70-99% or > 50% left main). Cardiac catheterization or CT FFR is recommended. Consider symptom-guided anti-ischemic pharmacotherapy as well as risk factor modification per guideline directed care. Invasive coronary angiography recommended with revascularization per published guideline statements.  6. CAD-RADS 5: Total coronary occlusion (100%). Consider cardiac catheterization or viability assessment. Consider symptom-guided anti-ischemic pharmacotherapy as well as risk factor modification per guideline directed care.  7. CAD-RADS N: Non-diagnostic study. Obstructive CAD can't be excluded. Alternative evaluation is recommended.  Wilbert Bihari, MD   Electronically Signed By: Wilbert Bihari M.D. On: 10/04/2021 09:47  Narrative EXAM: OVER-READ INTERPRETATION  CT  CHEST  The following report is an over-read performed by radiologist Dr. Juliene Balder of [facility] on 10/04/2021. This over-read does not include interpretation of cardiac or coronary anatomy or pathology. The coronary calcium  score/coronary CTA interpretation by the cardiologist is attached.  COMPARISON:  None.  FINDINGS: Vascular: Normal caliber of the visualized thoracic aorta. Main pulmonary arteries are incompletely evaluated but no large filling defects.  Mediastinum/Nodes: Visualized mediastinal structures are normal.  Lungs/Pleura: Dependent densities in the lower lungs are most compatible with atelectasis. Otherwise, the visualized lungs are clear without large pleural effusions.  Upper Abdomen: Low-density in the liver that is suspicious for steatosis.  Musculoskeletal: Bridging osteophytes in the thoracic spine.  IMPRESSION: 1. No acute extracardiac findings. 2. Visualized liver has low density. Findings can be associated with steatosis.  Electronically Signed: By: Juliene Balder M.D. On: 10/04/2021 09:15     ______________________________________________________________________________________________     Recent Labs: No results found for requested labs within last 365 days.  Recent Lipid Panel    Component Value Date/Time   CHOL 138 03/16/2022 0951   CHOL 171 01/14/2022 0837   TRIG 97 03/16/2022 0951   HDL 44 03/16/2022 0951   HDL 42 01/14/2022 0837   CHOLHDL 3.1 03/16/2022 0951   VLDL 19 03/16/2022 0951   LDLCALC 75 03/16/2022 0951   LDLCALC 101 (H) 01/14/2022 0837    History of Present Illness    68 year old male with the above past medical history including abnormal EKG, coronary artery calcification, dyspnea on exertion, hypertension, hyperlipidemia, thalassemia minor, BPH, OSA, GERD, former tobacco use, anxiety, and depression.  He was referred to cardiology in the setting of dyspnea on exertion.  He was last seen in the office on  09/17/2021 and reported dyspnea on exertion.  Coronary CT angiogram in 09/2021 revealed coronary calcium  score 8.54 (25th percentile), minimal atherosclerosis.  Echocardiogram 09/2021 showed EF 60 to 65%, normal  LV function, mild concentric LVH, G1 DD, normal RV systolic function, mild mitral valve prolapse, no evidence of mitral valve regurgitation or stenosis.  He was started on Crestor  10 mg daily.  He underwent L5-S1 lumbar anterior interbody fusion on 03/07/2024.  He presents today for follow-up.  Since his last visit he has stable mild dyspnea on exertion, unchanged from prior visits. He denies chest pain.  He is still recovering from his back surgery.  His activity has been somewhat limited in this setting.  Overall, he reports feeling well.  Home Medications    Current Outpatient Medications  Medication Sig Dispense Refill   ALPRAZolam (XANAX) 0.5 MG tablet Take by mouth.     ascorbic acid (VITAMIN C) 500 MG tablet Take 500 mg by mouth daily.     busPIRone (BUSPAR) 10 MG tablet Take 10 mg by mouth daily.     co-enzyme Q-10 30 MG capsule Take 30 mg by mouth 3 (three) times daily.     escitalopram (LEXAPRO) 10 MG tablet Take 10 mg by mouth daily.     fenofibrate (TRICOR) 145 MG tablet Take 145 mg by mouth daily.     ibuprofen  (ADVIL ,MOTRIN ) 800 MG tablet Take 1 tablet (800 mg total) by mouth 3 (three) times daily. (Patient taking differently: Take 800 mg by mouth as needed for moderate pain (pain score 4-6).) 21 tablet 0   losartan (COZAAR) 50 MG tablet Take by mouth.     Omega 3 1000 MG CAPS Take by mouth daily at 6 (six) AM.     omeprazole (PRILOSEC) 40 MG capsule Take 40 mg by mouth daily.       oxyCODONE -acetaminophen  (PERCOCET/ROXICET) 5-325 MG tablet Take 1 tablet by mouth every 6 (six) hours.     PARoxetine (PAXIL) 20 MG tablet  See Instructions, TAKE 1 TABLET BY MOUTH EVERY DAY, # 90 tab, 1 Refill(s), Pharmacy: [id number]     rosuvastatin  (CRESTOR ) 20 MG tablet TAKE 1 TABLET BY  MOUTH EVERY DAY 30 tablet 0   sildenafil (VIAGRA) 100 MG tablet Take 100 mg by mouth as directed.     tamsulosin (FLOMAX) 0.4 MG CAPS capsule Take 0.4 mg by mouth daily.     triamcinolone cream (KENALOG) 0.1 %   0   Turmeric 500 MG CAPS Take by mouth.     Vitamin D, Ergocalciferol, (DRISDOL) 1.25 MG (50000 UNIT) CAPS capsule Take 50,000 Units by mouth every 7 (seven) days.     montelukast (SINGULAIR) 10 MG tablet as needed (alletgies).     No current facility-administered medications for this visit.     Review of Systems    He denies chest pain, palpitations, pnd, orthopnea, n, v, dizziness, syncope, edema, weight gain, or early satiety. All other systems reviewed and are otherwise negative except as noted above.   Physical Exam    VS:  BP 132/86   Pulse 66   Ht 5' 10 (1.778 m)   Wt 179 lb (81.2 kg)   SpO2 96%   BMI 25.68 kg/m   GEN: Well nourished, well developed, in no acute distress. HEENT: normal. Neck: Supple, no JVD, carotid bruits, or masses. Cardiac: RRR, no murmurs, rubs, or gallops. No clubbing, cyanosis, edema.  Radials/DP/PT 2+ and equal bilaterally.  Respiratory:  Respirations regular and unlabored, clear to auscultation bilaterally. GI: Soft, nontender, nondistended, BS + x 4. MS: no deformity or atrophy. Skin: warm and dry, no rash. Neuro:  Strength and sensation are intact. Psych: Normal affect.  Accessory  Clinical Findings    ECG personally reviewed by me today - EKG Interpretation Date/Time:  Friday May 10 2024 10:11:23 EDT Ventricular Rate:  66 PR Interval:  170 QRS Duration:  82 QT Interval:  418 QTC Calculation: 438 R Axis:   72  Text Interpretation: Normal sinus rhythm Nonspecific T wave abnormality When compared with ECG of 26-Dec-2022 11:10, No significant change was found Confirmed by Daneen Perkins ([id number]) on 05/10/2024 10:15:00 AM  - no acute changes.   Lab Results  Component Value Date   HGB 11.8 (L) 07/17/2012   Lab Results   Component Value Date   CREATININE 0.92 09/17/2021   BUN 12 09/17/2021   NA 141 09/17/2021   K 4.3 09/17/2021   CL 101 09/17/2021   CO2 23 09/17/2021   Lab Results  Component Value Date   ALT 33 01/14/2022   Lab Results  Component Value Date   CHOL 138 03/16/2022   HDL 44 03/16/2022   LDLCALC 75 03/16/2022   TRIG 97 03/16/2022   CHOLHDL 3.1 03/16/2022    No results found for: HGBA1C  Assessment & Plan    1. Coronary artery calcification/dyspnea on exertion: Coronary CT angiogram in 09/2021 revealed coronary calcium  score 8.54 (25th percentile), minimal atherosclerosis. He has stable mild dyspnea on exertion, unchanged from prior visits. Denies chest pain. No indication for ischemic evaluation. Continue losartan, Crestor , fenofibrate.   2. Hypertension: BP mildly elevated in office today.  He has not been monitoring his BP consistently at home.  He will keep a BP log. Continue to monitor BP and report BP consistently > 130/80 mmHg. If BP remains elevated, consider increasing Losartan. Continue current antihypertensive regimen.   3. Hyperlipidemia: LDL was 60 in 11/2023. Monitored per PCP. Continue Crestor , fenofibrate.  4. Thalassemia minor: Denies any recent symptoms or concerns.  Managed per PCP.  5. OSA: Adherent to CPAP.  Followed by pulmonology.  6. Disposition: Follow-up in 1 year, sooner if needed.      Perkins JAYSON Daneen, NP 05/10/2024, 12:34 PM

## 2024-06-17 ENCOUNTER — Encounter: Payer: Self-pay | Admitting: Gastroenterology

## 2024-06-26 ENCOUNTER — Other Ambulatory Visit: Payer: Self-pay

## 2024-06-26 ENCOUNTER — Ambulatory Visit

## 2024-06-26 VITALS — Ht 70.0 in | Wt 180.0 lb

## 2024-06-26 DIAGNOSIS — Z800 Family history of malignant neoplasm of digestive organs: Secondary | ICD-10-CM

## 2024-06-26 DIAGNOSIS — Z860100 Personal history of colon polyps, unspecified: Secondary | ICD-10-CM

## 2024-06-26 MED ORDER — NA SULFATE-K SULFATE-MG SULF 17.5-3.13-1.6 GM/177ML PO SOLN
1.0000 | Freq: Once | ORAL | 0 refills | Status: AC
Start: 2024-06-26 — End: 2024-06-26

## 2024-06-26 NOTE — Progress Notes (Signed)
 Denies allergies to eggs or soy products. Denies complication of anesthesia or sedation. Denies use of weight loss medication. Denies use of O2.   Emmi instructions given for colonoscopy.

## 2024-06-27 ENCOUNTER — Encounter

## 2024-07-17 ENCOUNTER — Encounter: Payer: Self-pay | Admitting: Gastroenterology

## 2024-07-17 ENCOUNTER — Ambulatory Visit: Admitting: Gastroenterology

## 2024-07-17 VITALS — BP 136/72 | HR 59 | Temp 97.9°F | Resp 17 | Ht 70.0 in | Wt 180.0 lb

## 2024-07-17 DIAGNOSIS — Z860101 Personal history of adenomatous and serrated colon polyps: Secondary | ICD-10-CM | POA: Diagnosis not present

## 2024-07-17 DIAGNOSIS — Z1211 Encounter for screening for malignant neoplasm of colon: Secondary | ICD-10-CM | POA: Diagnosis not present

## 2024-07-17 DIAGNOSIS — Z800 Family history of malignant neoplasm of digestive organs: Secondary | ICD-10-CM

## 2024-07-17 DIAGNOSIS — Z860100 Personal history of colon polyps, unspecified: Secondary | ICD-10-CM

## 2024-07-17 MED ORDER — SODIUM CHLORIDE 0.9 % IV SOLN: 500.0000 mL | Freq: Once | INTRAVENOUS | Status: DC

## 2024-07-17 NOTE — Progress Notes (Signed)
 Report to PACU, RN, vss, BBS= Clear.

## 2024-07-17 NOTE — Op Note (Signed)
 [facility] Patient Name: Benjamin Lloyd Procedure Date: 07/17/2024 8:25 AM MRN: [id number] Endoscopist: Glendia E. Stacia , MD, [id number] Age: 68 Referring MD:  Date of Birth: Mar 12, 1956 Gender: Male Account #: [id number] Procedure:                Colonoscopy Indications:              High risk colon cancer surveillance: Personal                            history of multiple (3 or more) adenomas Medicines:                Monitored Anesthesia Care Procedure:                Pre-Anesthesia Assessment:                           - Prior to the procedure, a History and Physical                            was performed, and patient medications and                            allergies were reviewed. The patient's tolerance of                            previous anesthesia was also reviewed. The risks                            and benefits of the procedure and the sedation                            options and risks were discussed with the patient.                            All questions were answered, and informed consent                            was obtained. Prior Anticoagulants: The patient has                            taken no anticoagulant or antiplatelet agents. ASA                            Grade Assessment: II - A patient with mild systemic                            disease. After reviewing the risks and benefits,                            the patient was deemed in satisfactory condition to                            undergo the procedure.  After obtaining informed consent, the colonoscope                            was passed under direct vision. Throughout the                            procedure, the patient's blood pressure, pulse, and                            oxygen saturations were monitored continuously. The                            Olympus Scope SN [id number] was introduced through the                            anus and  advanced to the the terminal ileum, with                            identification of the appendiceal orifice and IC                            valve. The colonoscopy was performed without                            difficulty. The patient tolerated the procedure                            well. The quality of the bowel preparation was                            excellent. The terminal ileum, ileocecal valve,                            appendiceal orifice, and rectum were photographed.                            The bowel preparation used was SUPREP via split                            dose instruction. Scope In: 8:35:06 AM Scope Out: 8:43:23 AM Scope Withdrawal Time: 0 hours 5 minutes 43 seconds  Total Procedure Duration: 0 hours 8 minutes 17 seconds  Findings:                 The perianal and digital rectal examinations were                            normal. Pertinent negatives include normal                            sphincter tone and no palpable rectal lesions.                           The colon (entire examined portion) appeared normal.  The terminal ileum appeared normal.                           The retroflexed view of the distal rectum and anal                            verge was normal and showed no anal or rectal                            abnormalities. Complications:            No immediate complications. Estimated Blood Loss:     Estimated blood loss: none. Impression:               - The entire examined colon is normal.                           - The examined portion of the ileum was normal.                           - The distal rectum and anal verge are normal on                            retroflexion view.                           - No specimens collected. Recommendation:           - Patient has a contact number available for                            emergencies. The signs and symptoms of potential                            delayed  complications were discussed with the                            patient. Return to normal activities tomorrow.                            Written discharge instructions were provided to the                            patient.                           - Resume previous diet.                           - Continue present medications.                           - Repeat colonoscopy in 5 years for surveillance. Senaida Chilcote E. Stacia, MD 07/17/2024 8:46:52 AM This report has been signed electronically.

## 2024-07-17 NOTE — Progress Notes (Signed)
  Gastroenterology History and Physical   Primary Care Physician:  Toy Laurance POUR, MD   Reason for Procedure:   Colon cancer screening/history of polyps  Plan:    Colonoscopy    HPI: Benjamin Lloyd is a 68 y.o. male undergoing surveillance colonoscopy.  His mother was diagnosed with colon cancer in her 33s.  His last colonoscopy was in 2022 and 4 polyps were removed, 6-7 mm (3 Tas, 1 HP).  He had one polyp removed in 2017 (TA).     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   Anxiety    BPH (benign prostatic hyperplasia)    Depression    GERD (gastroesophageal reflux disease)    Hyperlipidemia    Hypertension    Metatarsal fracture    Sleep apnea 09/05/2010   mild-mod-does use a cpap-will bring dos   Thalassemia minor    no issues    Past Surgical History:  Procedure Laterality Date   APPENDECTOMY  09/05/1968   COLONOSCOPY  2017   Disc repair     L5 S1   EXCISION METACARPAL MASS Left 12/26/2022   Procedure: EXCISION MASS DORSUM LEFT RING FINGER PROXIMAL INTERPHALANGEAL JOINT;  Surgeon: Murrell Drivers, MD;  Location: [facility];  Service: Orthopedics;  Laterality: Left;   FINGER EXPLORATION  09/05/2008   mass lt finger   FOOT ARTHROTOMY     left   SHOULDER ARTHROSCOPY WITH DISTAL CLAVICLE RESECTION  07/17/2012   Procedure: SHOULDER ARTHROSCOPY WITH DISTAL CLAVICLE RESECTION;  Surgeon: Lamar LULLA Leonor Mickey., MD;  Location: [facility];  Service: Orthopedics;  Laterality: Right;  Right Shoulder Arthroscopy; Superior Labrum Anterior-Posterior Debridement / Repair; Subacromial Decompression and Distal Clavicle Resection   SHOULDER ARTHROSCOPY WITH SUBACROMIAL DECOMPRESSION  07/17/2012   Procedure: SHOULDER ARTHROSCOPY WITH SUBACROMIAL DECOMPRESSION;  Surgeon: Lamar LULLA Leonor Mickey., MD;  Location: [facility];  Service: Orthopedics;  Laterality: Right;  Right Shoulder Arthroscopy;  Superior Labrum Anterior-Posterior Debridement / Repair; Subacromial Decompression and Distal Clavicle Resection   UPPER GI ENDOSCOPY      Prior to Admission medications   Medication Sig Start Date End Date Taking? Authorizing Provider  ascorbic acid (VITAMIN C) 500 MG tablet Take 500 mg by mouth daily.   Yes [provider]  busPIRone (BUSPAR) 10 MG tablet Take 10 mg by mouth daily.   Yes [provider]  escitalopram (LEXAPRO) 10 MG tablet Take 10 mg by mouth daily.   Yes [provider]  fenofibrate (TRICOR) 145 MG tablet Take 145 mg by mouth daily.   Yes [provider]  gabapentin (NEURONTIN) 300 MG capsule Take 300 mg by mouth 3 (three) times daily.   Yes [provider]  losartan (COZAAR) 50 MG tablet Take by mouth. 06/16/21  Yes [provider]  montelukast (SINGULAIR) 10 MG tablet as needed (alletgies).   Yes [provider]  Omega 3 1000 MG CAPS Take by mouth daily at 6 (six) AM.   Yes [provider]  omeprazole (PRILOSEC) 40 MG capsule Take 40 mg by mouth daily.     Yes [provider]  PARoxetine (PAXIL) 20 MG tablet  See Instructions, TAKE 1 TABLET BY MOUTH EVERY DAY, # 90 tab, 1 Refill(s), Pharmacy: [id number] 06/01/21  Yes [provider]  rosuvastatin  (CRESTOR ) 20 MG tablet TAKE 1 TABLET BY MOUTH EVERY DAY 12/27/22  Yes Jeffrie Oneil BROCKS, MD  tamsulosin (FLOMAX) 0.4  MG CAPS capsule Take 0.4 mg by mouth daily.   Yes [provider]  ALPRAZolam (XANAX) 0.5 MG tablet Take by mouth. 11/19/19   [provider]  co-enzyme Q-10 30 MG capsule Take 30 mg by mouth 3 (three) times daily.    [provider]  ibuprofen  (ADVIL ,MOTRIN ) 800 MG tablet Take 1 tablet (800 mg total) by mouth 3 (three) times daily. 05/11/16   Tapia, Leisa, PA-C  oxyCODONE -acetaminophen  (PERCOCET/ROXICET) 5-325 MG tablet Take 1 tablet by mouth every 6 (six) hours. 05/01/24   [provider]   sildenafil (VIAGRA) 100 MG tablet Take 100 mg by mouth as directed.    [provider]  triamcinolone cream (KENALOG) 0.1 %  05/31/16   [provider]  Turmeric 500 MG CAPS Take by mouth.    [provider]  Vitamin D, Ergocalciferol, (DRISDOL) 1.25 MG (50000 UNIT) CAPS capsule Take 50,000 Units by mouth every 7 (seven) days.    [provider]    Current Outpatient Medications  Medication Sig Dispense Refill   ascorbic acid (VITAMIN C) 500 MG tablet Take 500 mg by mouth daily.     busPIRone (BUSPAR) 10 MG tablet Take 10 mg by mouth daily.     escitalopram (LEXAPRO) 10 MG tablet Take 10 mg by mouth daily.     fenofibrate (TRICOR) 145 MG tablet Take 145 mg by mouth daily.     gabapentin (NEURONTIN) 300 MG capsule Take 300 mg by mouth 3 (three) times daily.     losartan (COZAAR) 50 MG tablet Take by mouth.     montelukast (SINGULAIR) 10 MG tablet as needed (alletgies).     Omega 3 1000 MG CAPS Take by mouth daily at 6 (six) AM.     omeprazole (PRILOSEC) 40 MG capsule Take 40 mg by mouth daily.       PARoxetine (PAXIL) 20 MG tablet  See Instructions, TAKE 1 TABLET BY MOUTH EVERY DAY, # 90 tab, 1 Refill(s), Pharmacy: [id number]     rosuvastatin  (CRESTOR ) 20 MG tablet TAKE 1 TABLET BY MOUTH EVERY DAY 30 tablet 0   tamsulosin (FLOMAX) 0.4 MG CAPS capsule Take 0.4 mg by mouth daily.     ALPRAZolam (XANAX) 0.5 MG tablet Take by mouth.     co-enzyme Q-10 30 MG capsule Take 30 mg by mouth 3 (three) times daily.     ibuprofen  (ADVIL ,MOTRIN ) 800 MG tablet Take 1 tablet (800 mg total) by mouth 3 (three) times daily. 21 tablet 0   oxyCODONE -acetaminophen  (PERCOCET/ROXICET) 5-325 MG tablet Take 1 tablet by mouth every 6 (six) hours.     sildenafil (VIAGRA) 100 MG tablet Take 100 mg by mouth as directed.     triamcinolone cream (KENALOG) 0.1 %   0   Turmeric 500 MG CAPS Take by mouth.     Vitamin D, Ergocalciferol, (DRISDOL) 1.25 MG (50000 UNIT) CAPS capsule  Take 50,000 Units by mouth every 7 (seven) days.     Current Facility-Administered Medications  Medication Dose Route Frequency Provider Last Rate Last Admin   0.9 %  sodium chloride  infusion  500 mL Intravenous Once Stacia Glendia BRAVO, MD        Allergies as of 07/17/2024 - Review Complete 07/17/2024  Allergen Reaction Noted   Aspirin Other (See Comments) 08/24/2021   Other Other (See Comments) 06/06/2016   Plavix [clopidogrel] Other (See Comments) 08/24/2021   Sulfa antibiotics Other (See Comments) 08/24/2021    Family History  Problem Relation Age of  Onset   Hyperlipidemia Mother    Heart attack Mother    Colon cancer Mother 35   Heart disease Mother    Stroke Father    Hypertension Father    Heart disease Father    Hyperlipidemia Brother    Esophageal cancer Neg Hx    Rectal cancer Neg Hx    Stomach cancer Neg Hx     Social History   Socioeconomic History   Marital status: Married    Spouse name: Not on file   Number of children: 2    Years of education: Not on file   Highest education level: Not on file  Occupational History   Occupation: System Tech  Tobacco Use   Smoking status: Some Days    Types: Cigars   Smokeless tobacco: Never  Vaping Use   Vaping status: Never Used  Substance and Sexual Activity   Alcohol use: Yes    Alcohol/week: 5.0 standard drinks of alcohol    Types: 5 Standard drinks or equivalent per week    Comment: occ   Drug use: No   Sexual activity: Yes  Other Topics Concern   Not on file  Social History Narrative   3-4 caffeine drinks daily    Social Drivers of Health   Financial Resource Strain: Not on file  Food Insecurity: No Food Insecurity (03/07/2024)   Received from [facility]   Hunger Vital Sign    Within the past 12 months, you worried that your food would run out before you got the money to buy more.: Never true    Within the past 12 months, the food you bought just didn't last and you didn't have money to get  more.: Never true  Transportation Needs: No Transportation Needs (03/07/2024)   Received from [facility] - Transportation    Lack of Transportation (Medical): No    Lack of Transportation (Non-Medical): No  Physical Activity: Not on file  Stress: No Stress Concern Present (03/07/2024)   Received from [facility] - Occupational Stress Questionnaire    Feeling of Stress : Not at all  Social Connections: Not on file  Intimate Partner Violence: Not At Risk (03/07/2024)   Received from [facility]   HITS    Over the last 12 months how often did your partner physically hurt you?: Never    Over the last 12 months how often did your partner insult you or talk down to you?: Never    Over the last 12 months how often did your partner threaten you with physical harm?: Never    Over the last 12 months how often did your partner scream or curse at you?: Never    Review of Systems:  All other review of systems negative except as mentioned in the HPI.  Physical Exam: Vital signs BP (!) 143/74   Pulse 67   Temp 97.9 F (36.6 C) (Skin)   Ht 5' 10 (1.778 m)   Wt 180 lb (81.6 kg)   SpO2 96%   BMI 25.83 kg/m   General:   Alert,  Well-developed, well-nourished, pleasant and cooperative in NAD Airway:  Mallampati 3 Lungs:  Clear throughout to auscultation.   Heart:  Regular rate and rhythm; no murmurs, clicks, rubs,  or gallops. Abdomen:  Soft, nontender and nondistended. Normal bowel sounds.   Neuro/Psych:  Normal mood and affect. A and O x 3   Arye Weyenberg E. Stacia, MD [facility] Gastroenterology

## 2024-07-17 NOTE — Patient Instructions (Signed)
 Please read handouts provided. Continue present medications. Resume previous diet. Repeat colonoscopy in 5 years for screening.   YOU HAD AN ENDOSCOPIC PROCEDURE TODAY AT THE [facility]:   Refer to the procedure report that was given to you for any specific questions about what was found during the examination.  If the procedure report does not answer your questions, please call your gastroenterologist to clarify.  If you requested that your care partner not be given the details of your procedure findings, then the procedure report has been included in a sealed envelope for you to review at your convenience later.  YOU SHOULD EXPECT: Some feelings of bloating in the abdomen. Passage of more gas than usual.  Walking can help get rid of the air that was put into your GI tract during the procedure and reduce the bloating. If you had a lower endoscopy (such as a colonoscopy or flexible sigmoidoscopy) you may notice spotting of blood in your stool or on the toilet paper. If you underwent a bowel prep for your procedure, you may not have a normal bowel movement for a few days.  Please Note:  You might notice some irritation and congestion in your nose or some drainage.  This is from the oxygen used during your procedure.  There is no need for concern and it should clear up in a day or so.  SYMPTOMS TO REPORT IMMEDIATELY:  Following lower endoscopy (colonoscopy or flexible sigmoidoscopy):  Excessive amounts of blood in the stool  Significant tenderness or worsening of abdominal pains  Swelling of the abdomen that is new, acute  Fever of 100F or higher.  For urgent or emergent issues, a gastroenterologist can be reached at any hour by calling [phone]. Do not use MyChart messaging for urgent concerns.    DIET:  We do recommend a small meal at first, but then you may proceed to your regular diet.  Drink plenty of fluids but you should avoid alcoholic beverages for 24  hours.  ACTIVITY:  You should plan to take it easy for the rest of today and you should NOT DRIVE or use heavy machinery until tomorrow (because of the sedation medicines used during the test).    FOLLOW UP: Our staff will call the number listed on your records the next business day following your procedure.  We will call around 7:15- 8:00 am to check on you and address any questions or concerns that you may have regarding the information given to you following your procedure. If we do not reach you, we will leave a message.     If any biopsies were taken you will be contacted by phone or by letter within the next 1-3 weeks.  Please call us  at [phone] if you have not heard about the biopsies in 3 weeks.    SIGNATURES/CONFIDENTIALITY: You and/or your care partner have signed paperwork which will be entered into your electronic medical record.  These signatures attest to the fact that that the information above on your After Visit Summary has been reviewed and is understood.  Full responsibility of the confidentiality of this discharge information lies with you and/or your care-partner.

## 2024-07-17 NOTE — Progress Notes (Signed)
Pt. states no medical or surgical changes since previsit or office visit. 

## 2024-07-18 ENCOUNTER — Telehealth: Payer: Self-pay

## 2024-07-18 NOTE — Telephone Encounter (Signed)
 No answer after follow up call. Left VM.

## 2024-09-02 ENCOUNTER — Telehealth (HOSPITAL_BASED_OUTPATIENT_CLINIC_OR_DEPARTMENT_OTHER): Payer: Self-pay | Admitting: *Deleted

## 2024-09-02 DIAGNOSIS — N400 Enlarged prostate without lower urinary tract symptoms: Secondary | ICD-10-CM | POA: Insufficient documentation

## 2024-09-02 DIAGNOSIS — S62308A Unspecified fracture of other metacarpal bone, initial encounter for closed fracture: Secondary | ICD-10-CM | POA: Insufficient documentation

## 2024-09-02 DIAGNOSIS — D75A Glucose-6-phosphate dehydrogenase (G6PD) deficiency without anemia: Secondary | ICD-10-CM | POA: Insufficient documentation

## 2024-09-02 DIAGNOSIS — D563 Thalassemia minor: Secondary | ICD-10-CM | POA: Insufficient documentation

## 2024-09-02 DIAGNOSIS — G4733 Obstructive sleep apnea (adult) (pediatric): Secondary | ICD-10-CM | POA: Insufficient documentation

## 2024-09-02 DIAGNOSIS — F32A Depression, unspecified: Secondary | ICD-10-CM | POA: Insufficient documentation

## 2024-09-02 DIAGNOSIS — N529 Male erectile dysfunction, unspecified: Secondary | ICD-10-CM | POA: Insufficient documentation

## 2024-09-02 NOTE — Telephone Encounter (Signed)
 Not on anticoagulation, pharmD input not needed

## 2024-09-02 NOTE — Telephone Encounter (Signed)
" ° °  Name: Benjamin Lloyd  DOB: 23-Jul-1956  MRN: [id number]  Primary Cardiologist: Oneil Parchment, MD  Chart reviewed as part of pre-operative protocol coverage. Because of [name]'s past medical history and time since last visit, he will require a follow-up in-office visit in order to better assess preoperative cardiovascular risk.   Please make appointment for clearance as the surgery is considered high risk (lasting >1 hour).  He was having DOE which was stable at the time and elevated BP which was to be monitored at home. Surgery is scheduled for 4/20/202. Last seen by Damien Braver, DNP.  Pre-op covering staff: - Please schedule appointment and call patient to inform them. If patient already had an upcoming appointment within acceptable timeframe, please add pre-op clearance to the appointment notes so provider is aware. - Please contact requesting surgeon's office via preferred method (i.e, phone, fax) to inform them of need for appointment prior to surgery.   Lamarr Satterfield, NP  09/02/2024, 1:13 PM   "

## 2024-09-02 NOTE — Telephone Encounter (Signed)
"  ° °  Pre-operative Risk Assessment    Patient Name: Benjamin Lloyd  DOB: 1956/03/24 MRN: [id number]   Date of last office visit: 05/10/24 DAMIEN BRAVER, NP Date of next office visit: NONE    Request for Surgical Clearance    Procedure:  LEFT TOTAL HIP ARTHROPLASTY  Date of Surgery:  Clearance 12/23/24                                Surgeon:  DR. MATTHEW OLIN Surgeon's Group or Practice Name:  [facility] Phone number:  [phone] [facility] Fax number:  [phone]   Type of Clearance Requested:   - Medical    Type of Anesthesia:  Spinal   Additional requests/questions:    [name]   09/02/2024, 12:23 PM   "
